# Patient Record
Sex: Male | Born: 1995 | Hispanic: Yes | Marital: Single | Smoking: Current some day smoker
Health system: Southern US, Community
[De-identification: ages and names within clinical notes are randomized; demographics above are authoritative.]

## PROBLEM LIST (undated history)

## (undated) DIAGNOSIS — Z789 Other specified health status: Secondary | ICD-10-CM

## (undated) HISTORY — PX: NO PAST SURGERIES: SHX2092

---
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
## 2015-04-17 ENCOUNTER — Encounter (HOSPITAL_COMMUNITY): Payer: Self-pay

## 2015-04-17 ENCOUNTER — Emergency Department (HOSPITAL_COMMUNITY)
Admission: EM | Admit: 2015-04-17 | Discharge: 2015-04-18 | Disposition: A | Discharge status: Other Acute Inpatient Hospital | Payer: No Typology Code available for payment source | Attending: Emergency Medicine | Admitting: Emergency Medicine

## 2015-04-17 DIAGNOSIS — Z720 Tobacco use: Secondary | ICD-10-CM | POA: Diagnosis not present

## 2015-04-17 DIAGNOSIS — R45851 Suicidal ideations: Secondary | ICD-10-CM

## 2015-04-17 DIAGNOSIS — F329 Major depressive disorder, single episode, unspecified: Secondary | ICD-10-CM | POA: Diagnosis not present

## 2015-04-17 DIAGNOSIS — Z79899 Other long term (current) drug therapy: Secondary | ICD-10-CM | POA: Diagnosis not present

## 2015-04-17 LAB — CBC
HCT: 48.8 % (ref 39.0–52.0)
Hemoglobin: 17.4 g/dL — ABNORMAL HIGH (ref 13.0–17.0)
MCH: 31.1 pg (ref 26.0–34.0)
MCHC: 35.7 g/dL (ref 30.0–36.0)
MCV: 87.3 fL (ref 78.0–100.0)
Platelets: 269 10*3/uL (ref 150–400)
RBC: 5.59 MIL/uL (ref 4.22–5.81)
RDW: 12.3 % (ref 11.5–15.5)
WBC: 9.7 10*3/uL (ref 4.0–10.5)

## 2015-04-17 LAB — SALICYLATE LEVEL: Salicylate Lvl: <4 mg/dL (ref 2.8–30.0)

## 2015-04-17 LAB — COMPREHENSIVE METABOLIC PANEL
ALBUMIN: 4.8 g/dL (ref 3.5–5.0)
ALT: 193 U/L — ABNORMAL HIGH (ref 17–63)
AST: 94 U/L — AB (ref 15–41)
Alkaline Phosphatase: 70 U/L (ref 38–126)
Anion gap: 4 — ABNORMAL LOW (ref 5–15)
BUN: 14 mg/dL (ref 6–20)
CHLORIDE: 106 mmol/L (ref 101–111)
CO2: 29 mmol/L (ref 22–32)
CREATININE: 1.01 mg/dL (ref 0.61–1.24)
Calcium: 9.7 mg/dL (ref 8.9–10.3)
GFR calc Af Amer: >60 mL/min (ref >60)
GFR calc non Af Amer: >60 mL/min (ref >60)
GLUCOSE: 102 mg/dL — AB (ref 65–99)
Potassium: 4 mmol/L (ref 3.5–5.1)
SODIUM: 139 mmol/L (ref 135–145)
Total Bilirubin: 0.8 mg/dL (ref 0.3–1.2)
Total Protein: 8 g/dL (ref 6.5–8.1)

## 2015-04-17 LAB — ACETAMINOPHEN LEVEL: Acetaminophen (Tylenol), Serum: <10 ug/mL — ABNORMAL LOW (ref 10–30)

## 2015-04-17 LAB — ETHANOL: Alcohol, Ethyl (B): <5 mg/dL (ref <5)

## 2015-04-17 NOTE — BH Assessment (Signed)
[facility], admitting RN on adult has requested pt come at 11:30. Informed Chartered certified accountantLynnsey RN.   Clista BernhardtNancy Nicky Kras, [facility] T[facility] Specialist 04/17/2015 10:56 PM

## 2015-04-17 NOTE — BH Assessment (Signed)
Reviewed ED notes prior to initiating assessment. Per notes pt has been having SI since age [age] and now feels that he is getting older he has more options for carrying out plans.   Assessment to commence shortly.    Clista BernhardtNancy Hanaan Gancarz, [facility] T[facility] Specialist 04/17/2015 9:44 PM

## 2015-04-17 NOTE — ED Notes (Addendum)
Pt presents with c/o suicidal thoughts. Pt reports he has been having these thoughts since he was 10 or 11 but now that he is much older he has more tools that he could use to hurt himself. Pt reports that he did overdose last year and cut his arms with a serrated blade and laid down in the bed. Pt reports that he is thinking something "quick and easy, like something to the brain". Pt denies HI at this time. Calm and cooperative.

## 2015-04-17 NOTE — ED Provider Notes (Signed)
CSN: [id number]     Arrival date & time 04/17/15  2053 History   First MD Initiated Contact with Patient 04/17/15 2103     Chief Complaint  Patient presents with  . Suicidal     (Consider location/radiation/quality/duration/timing/severity/associated sxs/prior Treatment) Patient is a 19 y.o. male presenting with mental health disorder. The history is provided by the patient.  Mental Health Problem Presenting symptoms: depression, suicidal thoughts and suicidal threats   Presenting symptoms: no suicide attempt   Degree of incapacity (severity):  Moderate Onset quality:  Gradual Timing:  Constant Progression:  Worsening Chronicity:  Chronic Context: stressful life event   Context: not recent medication change   Treatment compliance:  Untreated Relieved by:  Nothing Worsened by:  Nothing tried Associated symptoms: feelings of worthlessness (feels like he has no direction. He works with his father - in [employer] - but doesn't feel like he has any support)   Associated symptoms: no abdominal pain and no chest pain     History reviewed. No pertinent past medical history. History reviewed. No pertinent past surgical history. No family history on file. History  Substance Use Topics  . Smoking status: Current Some Day Smoker  . Smokeless tobacco: Not on file  . Alcohol Use: No    Review of Systems  Constitutional: Negative for fever.  Respiratory: Negative for cough and shortness of breath.   Cardiovascular: Negative for chest pain.  Gastrointestinal: Negative for vomiting and abdominal pain.  Psychiatric/Behavioral: Positive for suicidal ideas.  All other systems reviewed and are negative.     Allergies  Review of patient's allergies indicates no known allergies.  Home Medications   Prior to Admission medications   Medication Sig Start Date End Date Taking? Authorizing Provider  Ascorbic Acid (VITAMIN C PO) Take 1 tablet by mouth daily.   Yes Historical Provider, MD   ibuprofen (ADVIL,MOTRIN) 200 MG tablet Take 1,000-1,200 mg by mouth every 6 (six) hours as needed for moderate pain.   Yes Historical Provider, MD  Multiple Vitamin (MULTIVITAMIN WITH MINERALS) TABS tablet Take 1 tablet by mouth daily.   Yes Historical Provider, MD  Omega-3 Fatty Acids (FISH OIL PO) Take 1 capsule by mouth daily.   Yes Historical Provider, MD  terbinafine (LAMISIL AT) 1 % cream Apply to both feet 2 x day coating the entire foot and in between the toes for 14 days. Patient not taking: Reported on 04/17/2015 11/21/12   Arthor Captain, PA-C   BP 150/72 mmHg  Pulse 83  Temp(Src) 98.5 F (36.9 C) (Oral)  Resp 22  SpO2 96% Physical Exam  Constitutional: He is oriented to person, place, and time. He appears well-developed and well-nourished. No distress.  HENT:  Head: Normocephalic and atraumatic.  Mouth/Throat: No oropharyngeal exudate.  Eyes: EOM are normal. Pupils are equal, round, and reactive to light.  Neck: Normal range of motion. Neck supple.  Cardiovascular: Normal rate and regular rhythm.  Exam reveals no friction rub.   No murmur heard. Pulmonary/Chest: Effort normal and breath sounds normal. No respiratory distress. He has no wheezes. He has no rales.  Abdominal: He exhibits no distension. There is no tenderness. There is no rebound.  Musculoskeletal: Normal range of motion. He exhibits no edema.  Neurological: He is alert and oriented to person, place, and time.  Skin: He is not diaphoretic.  Psychiatric: He expresses suicidal ideation. He expresses no homicidal ideation. He expresses suicidal plans. He expresses no homicidal plans.  Nursing note and vitals reviewed.  ED Course  Procedures (including critical care time) Labs Review Labs Reviewed  COMPREHENSIVE METABOLIC PANEL - Abnormal; Notable for the following:    Glucose, Bld 102 (*)    AST 94 (*)    ALT 193 (*)    Anion gap 4 (*)    All other components within normal limits  ACETAMINOPHEN LEVEL -  Abnormal; Notable for the following:    Acetaminophen (Tylenol), Serum <10 (*)    All other components within normal limits  CBC - Abnormal; Notable for the following:    Hemoglobin 17.4 (*)    All other components within normal limits  ETHANOL  SALICYLATE LEVEL  URINE RAPID DRUG SCREEN, HOSP PERFORMED    Imaging Review No results found.   EKG Interpretation None      MDM   Final diagnoses:  Suicidal ideation    64M here suicidal. Present for a long time, about 5-6 years, however states he's been seriously thinking about it for 2 years. Denies any homicidal ideation.  Patient here voluntarily. Patient admitted to Psych.    Elwin MochaBlair Shayra Anton, MD 04/17/15 [id number]

## 2015-04-17 NOTE — ED Notes (Signed)
[facility] called for transport. 
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
## 2015-04-18 ENCOUNTER — Encounter (HOSPITAL_COMMUNITY): Payer: Self-pay | Admitting: *Deleted

## 2015-04-18 ENCOUNTER — Inpatient Hospital Stay (HOSPITAL_COMMUNITY)
Admission: EM | Admit: 2015-04-18 | Discharge: 2015-04-20 | DRG: 885 | Disposition: A | Discharge status: Home / Self Care | Payer: Federal, State, Local not specified - Other | Source: Intra-hospital | Attending: Psychiatry | Admitting: Psychiatry

## 2015-04-18 DIAGNOSIS — R45851 Suicidal ideations: Secondary | ICD-10-CM | POA: Diagnosis present

## 2015-04-18 DIAGNOSIS — F332 Major depressive disorder, recurrent severe without psychotic features: Secondary | ICD-10-CM | POA: Diagnosis present

## 2015-04-18 DIAGNOSIS — K769 Liver disease, unspecified: Secondary | ICD-10-CM

## 2015-04-18 DIAGNOSIS — F17210 Nicotine dependence, cigarettes, uncomplicated: Secondary | ICD-10-CM | POA: Diagnosis present

## 2015-04-18 DIAGNOSIS — F329 Major depressive disorder, single episode, unspecified: Secondary | ICD-10-CM | POA: Diagnosis present

## 2015-04-18 HISTORY — DX: Other specified health status: Z78.9

## 2015-04-18 LAB — TSH: TSH: 5.784 u[IU]/mL — ABNORMAL HIGH (ref 0.350–4.500)

## 2015-04-18 LAB — RAPID URINE DRUG SCREEN, HOSP PERFORMED
AMPHETAMINES: NOT DETECTED
Barbiturates: NOT DETECTED
Benzodiazepines: NOT DETECTED
Cocaine: NOT DETECTED
Opiates: NOT DETECTED
Tetrahydrocannabinol: NOT DETECTED

## 2015-04-18 MED ORDER — TRAZODONE HCL 50 MG PO TABS
50.0000 mg | ORAL_TABLET | Freq: Every evening | ORAL | Status: DC | PRN
  Administered 2015-04-18 – 2015-04-19 (×2): 50 mg via ORAL
  Filled 2015-04-18 (×3): qty 1
  Filled 2015-04-18: qty 14
  Filled 2015-04-18 (×2): qty 1
  Filled 2015-04-18: qty 14
  Filled 2015-04-18: qty 1

## 2015-04-18 MED ORDER — ACETAMINOPHEN 325 MG PO TABS: 650.0000 mg | ORAL_TABLET | Freq: Four times a day (QID) | ORAL | Status: DC | PRN

## 2015-04-18 MED ORDER — CITALOPRAM HYDROBROMIDE 10 MG PO TABS
10.0000 mg | ORAL_TABLET | Freq: Every day | ORAL | Status: DC
  Administered 2015-04-18 – 2015-04-19 (×2): 10 mg via ORAL
  Filled 2015-04-18 (×3): qty 1

## 2015-04-18 MED ORDER — MAGNESIUM HYDROXIDE 400 MG/5ML PO SUSP
30.0000 mL | Freq: Every day | ORAL | Status: DC | PRN
  Administered 2015-04-19: 30 mL via ORAL
  Filled 2015-04-18: qty 30

## 2015-04-18 MED ORDER — HYDROXYZINE HCL 25 MG PO TABS
25.0000 mg | ORAL_TABLET | Freq: Four times a day (QID) | ORAL | Status: DC | PRN
  Filled 2015-04-18: qty 20

## 2015-04-18 MED ORDER — ALUM & MAG HYDROXIDE-SIMETH 200-200-20 MG/5ML PO SUSP: 30.0000 mL | ORAL | Status: DC | PRN

## 2015-04-18 NOTE — Plan of Care (Signed)
Problem: Diagnosis: Increased Risk For Suicide Attempt Goal: STG-Patient Will Attend All Groups On The Unit Outcome: Progressing Pt attended and participated in evening group on 04/18/15.

## 2015-04-18 NOTE — Progress Notes (Addendum)
Patient flat, depressed both in affect and mood. Rating his depression at a 7/10, hopelessness at a 6/10 and anxiety at an 8/10. Patient reports very poor sleep last night. Patient visible in dayroom however forwards little 1:1 with this Clinical research associatewriter. Reports his goal is to "find the courage to share about myself and to be more talkative." Patient offered support and reassurance. Encouraged to participate in programming. Also encouraged to speak to provider regarding sleep concerns. Patient verbalized understanding. He does endorse thoughts to harm self and verbally contracts for safety. No HI. Patient safe. Maurice Freeman, Maurice Freeman

## 2015-04-18 NOTE — Plan of Care (Signed)
Problem: Ineffective individual coping Goal: STG: Patient will remain free from self harm Outcome: Progressing Patient endorsing thoughts to harm self however has not engaged. Verbally contracts for safety.   Problem: Alteration in mood Goal: STG-Patient reports thoughts of self-harm to staff Outcome: Progressing Patient open with this writer about his thoughts of self harm. Contracts.

## 2015-04-18 NOTE — [facility] Suicide Risk Assessment (Signed)
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 MDD (major depressive disorder), recurrent episode, severe [F33.2] 04/18/2015     Continued Clinical Symptoms:  Alcohol Use Disorder Identification Test Final Score (AUDIT): 3 The "Alcohol Use Disorders Identification Test", Guidelines for Use in Primary Care, Second Edition.  World Science [employer]Health [employer] [facility](WHO). Score between 0-7:  no or low risk or alcohol related problems. Score between 8-15:  moderate risk of alcohol related problems. Score between 16-19:  high risk of alcohol related problems. Score 20 or above:  warrants further diagnostic evaluation for alcohol dependence and treatment.   CLINICAL FACTORS:  19 year old male, reports chronic depression, with chronic mostly passive SI and prior history of self cutting. No history of psychosis, no clear history of hypomania or mania, denies drug or alcohol abuse . Increased depression and suicidal ruminations recently. No prior psychiatric medication trials.   Musculoskeletal: Strength & Muscle Tone: within normal limits Gait & Station: normal Patient leans: N/A  Psychiatric Specialty Exam: Physical Exam  ROS  Blood pressure 113/76, pulse 75, temperature 99.2 F (37.3 C), temperature source Oral, resp. rate 18, height 5\' 7"  (1.702 m), weight 223 lb (101.152 kg).Body mass index is 34.92 kg/(m^2).   See Admit Note MSE                                                        COGNITIVE FEATURES THAT CONTRIBUTE TO RISK:  Closed-mindedness and Loss of executive function    SUICIDE RISK:   Moderate:  Frequent suicidal ideation with limited intensity, and duration, some specificity in terms of plans, no associated intent, good self-control, limited dysphoria/symptomatology, some risk factors present, and identifiable protective factors, including available and accessible social support.  PLAN OF CARE: Patient will be admitted to inpatient psychiatric unit for stabilization and safety. Will provide and encourage milieu participation. Provide medication management and maked adjustments as needed.  Will follow daily.    Medical Decision Making:  New problem, with additional work up planned, Review of Psycho-Social Stressors (1), Review or order clinical lab tests (1), Established Problem, Worsening (2) and Review of Medication Regimen & Side Effects (2)  I certify that inpatient services furnished can reasonably be expected to improve the patient's condition.   COBOS, FERNANDO 04/18/2015, 5:16 PM

## 2015-04-18 NOTE — [facility] Group Notes (Signed)
[facility]  04/18/2015 8:45 AM  Participation Quality: Alert, Appropriate and Oriented  Mood/Affect: Flat  Depression Rating: 8  Anxiety Rating: 8  Thoughts of Suicide: Pt endorses passive SI  Will you contract for safety? Yes  Current AVH: Pt denies  Plan for Discharge/Comments: Pt attended discharge planning group and actively participated in group. CSW discussed suicide prevention education with the group and encouraged them to discuss discharge planning and any relevant barriers. Pt reports that he is still feeling depressed but is hoping to find help in the hospital.  Transportation Means: Pt reports access to transportation  Supports: Emeline GeneralFriend  Clytee Heinrich Carter, LCSWA 04/18/2015 12:21 PM

## 2015-04-18 NOTE — Progress Notes (Signed)
19 year old male pt admitted on voluntary basis. Pt reports that he has been feeling depressed and suicidal and cites difficulties with parents, occupational and financial stressors that help contribute to his depression. Pt reports he has been feeling depressed for awhile and feels his parents are not supportive of him. Pt reports that he is not and has never been on any medications but reported that he would like to not feel depressed. Pt does have some scars to left arm that he reports were self inflicted. Pt denied any SI during admission process and able to contract for safety on the unit. Pt was oriented to unit and safety maintained.

## 2015-04-18 NOTE — H&P (Addendum)
Psychiatric Admission Assessment Adult  Patient Identification: Maurice Freeman MRN:  [id number] Date of Evaluation:  04/18/2015 Chief Complaint:   " Depression" Principal Diagnosis: <principal problem not specified> Diagnosis:   Patient Active Problem List   Diagnosis Date Noted  . 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 He states he has chronic depression, which started about 8 years ago. He states he has a friend who also has depression and they " try to support one another", but recently has felt more depressed and so decided to seek treatment. He endorses some neuro-vegetative symptoms of depression as below, and states he has a history of cutting. He has had suicidal ideations, mostly passive , for a long time, but states recently has been thinking of tools he could use to hurt himself , due to which he decided to seek treatment. He also reports sleep paralysis symptoms where he wakes up but cannot move body and feels " a presence " in his room. He denies any hallucinatory experiences other than in this context. Denies narcolepsy, sleep walking. He also describes anxiety, with some social anxiety, phobia.   Elements:   Chronic depression, chronic passive SI, recently increasing, with some suicidal thoughts of cutting .  Associated Signs/Symptoms: Depression Symptoms:  depressed mood, anhedonia, insomnia, recurrent thoughts of death, suicidal thoughts without plan, suicidal thoughts with specific plan, anxiety, loss of energy/fatigue, (Hypo) Manic Symptoms:   Denies . At this time does not present with pressured speech, rapid or racing thoughts, and does not present with any manic symptoms currently. Anxiety Symptoms:   Social anxiety symptoms described. Also some panic attacks . Psychotic Symptoms:   Denies PTSD Symptoms: Does not endorse  Total Time spent with patient: 45 minutes   Psychiatric History- endorses chronic depression, going on for years. Describes history of self cutting and chronic suicidal ruminations, mostly passive , denies history of mania, describes some  Brief mood instability/ swings , but not hypomania.  Self cutting episodes have decreased overtime.  Has not been on psychiatric medications in the past, no prior psychiatric admissions.   Past Medical History:  Denies medical illnesses, smokes 1 PPD, NKDA Past Medical History  Diagnosis Date  . Medical history non-contributory     Past Surgical History  Procedure Laterality Date  . No past surgeries     Family History: lives with parents, has two siblings, no mental illness, no suicides in family.  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 No SO at this time, denies legal issues .  History  Alcohol Use  . Yes    Comment: once a week     History  Drug Use No    History   Social History  . Marital Status: Single    Spouse Name: N/A  . Number of Children: N/A  . Years of Education: N/A   Social History Main Topics  . Smoking status: Current Some Day Smoker -- 0.25 packs/day  . Smokeless tobacco: Not on file  . Alcohol Use: Yes     Comment: once a week  . Drug Use: No  . Sexual Activity: Not on file   Other Topics Concern  . None   Social History Narrative   Additional Social  History:  Musculoskeletal: Strength & Muscle Tone: within normal limits Gait & Station: normal Patient leans: N/A  Psychiatric Specialty Exam: Physical Exam  Review of Systems  Constitutional: Negative.   HENT: Negative.   Eyes: Negative.   Respiratory: Negative.   Cardiovascular: Negative.   Gastrointestinal: Negative.   Genitourinary: Negative.   Musculoskeletal:  Negative.   Skin: Negative.   Neurological: Negative.   Endo/Heme/Allergies: Negative.   Psychiatric/Behavioral: Positive for depression and suicidal ideas. The patient is nervous/anxious and has insomnia.   All other systems negative   Blood pressure 113/76, pulse 75, temperature 99.2 F (37.3 C), temperature source Oral, resp. rate 18, height 5\' 7"  (1.702 m), weight 223 lb (101.152 kg).Body mass index is 34.92 kg/(m^2).  General Appearance: Fairly Groomed  Patent attorneyye Contact::  Good  Speech:  Normal Rate  Volume:  Decreased  Mood:  Anxious and Depressed  Affect:  Constricted  Thought Process:  Goal Directed and Linear  Orientation:  Full (Time, Place, and Person)  Thought Content:  denies hallucinations, no delusions  Suicidal Thoughts:  No- at present denies any suicidal plan or intention and contracts for safety on the unit .  Homicidal Thoughts:  No  Memory:  recent and remote grossly intact   Judgement:  Fair  Insight:  Fair  Psychomotor Activity:  Normal  Concentration:  Good  Recall:  Good  Fund of Knowledge:Good  Language: Fair  Akathisia:  Negative  Handed:  Right  AIMS (if indicated):     Assets:  Desire for Improvement Physical Health Resilience  ADL's:  Fair   Cognition: WNL  Sleep:  Number of Hours: 4.25   Risk to Self: Is patient at risk for suicide?: Yes What has been your use of drugs/alcohol within the last 12 months?: no drug use; Reports one bottle of beer weekly, occassionally gets drunk Risk to Others:   Prior Inpatient Therapy:   Prior Outpatient Therapy:    Alcohol Screening: 1. How often do you have a drink containing alcohol?: 2 to 4 times a month 2. How many drinks containing alcohol do you have on a typical day when you are drinking?: 1 or 2 3. How often do you have six or more drinks on one occasion?: Less than monthly Preliminary Score: 1 4. How often during the last year have you found that you were not able to stop drinking once you had  started?: Never 5. How often during the last year have you failed to do what was normally expected from you becasue of drinking?: Never 6. How often during the last year have you needed a first drink in the morning to get yourself going after a heavy drinking session?: Never 7. How often during the last year have you had a feeling of guilt of remorse after drinking?: Never 8. How often during the last year have you been unable to remember what happened the night before because you had been drinking?: Never 9. Have you or someone else been injured as a result of your drinking?: No 10. Has a relative or friend or a doctor or another health worker been concerned about your drinking or suggested you cut down?: No Alcohol Use Disorder Identification Test Final Score (AUDIT): 3 Brief Intervention: AUDIT score less than 7 or less-screening does not suggest unhealthy drinking-brief intervention not indicated  Allergies:  No Known Allergies Lab Results:  Results for orders placed or performed during the hospital encounter of 04/18/15 (from the past 48 hour(s))  Urine rapid drug  screen (hosp performed)     Status: None   Collection Time: 04/18/15  6:20 AM  Result Value Ref Range   Opiates NONE DETECTED NONE DETECTED   Cocaine NONE DETECTED NONE DETECTED   Benzodiazepines NONE DETECTED NONE DETECTED   Amphetamines NONE DETECTED NONE DETECTED   Tetrahydrocannabinol NONE DETECTED NONE DETECTED   Barbiturates NONE DETECTED NONE DETECTED    Comment:        DRUG SCREEN FOR MEDICAL PURPOSES ONLY.  IF CONFIRMATION IS NEEDED FOR ANY PURPOSE, NOTIFY LAB WITHIN 5 DAYS.        LOWEST DETECTABLE LIMITS FOR URINE DRUG SCREEN Drug Class       Cutoff (ng/mL) Amphetamine      1000 Barbiturate      200 Benzodiazepine   200 Tricyclics       300 Opiates          300 Cocaine          300 THC              50 Performed at [facility]   TSH     Status: Abnormal   Collection Time: 04/18/15   6:24 AM  Result Value Ref Range   TSH 5.784 (H) 0.350 - 4.500 uIU/mL    Comment: Performed at [facility]   Current Medications: Current Facility-Administered Medications  Medication Dose Route Frequency Provider Last Rate Last Dose  . acetaminophen (TYLENOL) tablet 650 mg  650 mg Oral Q6H PRN Kerry Hough, PA-C      . alum & mag hydroxide-simeth (MAALOX/MYLANTA) 200-200-20 MG/5ML suspension 30 mL  30 mL Oral Q4H PRN Kerry Hough, PA-C      . citalopram (CELEXA) tablet 10 mg  10 mg Oral Daily Craige Cotta, MD   10 mg at 04/18/15 1310  . hydrOXYzine (ATARAX/VISTARIL) tablet 25 mg  25 mg Oral Q6H PRN Kerry Hough, PA-C      . magnesium hydroxide (MILK OF MAGNESIA) suspension 30 mL  30 mL Oral Daily PRN Kerry Hough, PA-C      . traZODone (DESYREL) tablet 50 mg  50 mg Oral QHS,MR X 1 Spencer E Simon, PA-C       PTA Medications: Prescriptions prior to admission  Medication Sig Dispense Refill Last Dose  . Ascorbic Acid (VITAMIN C PO) Take 1 tablet by mouth daily.   04/14/2015  . ibuprofen (ADVIL,MOTRIN) 200 MG tablet Take 1,000-1,200 mg by mouth every 6 (six) hours as needed for moderate pain.   Past Month at Unknown time  . Multiple Vitamin (MULTIVITAMIN WITH MINERALS) TABS tablet Take 1 tablet by mouth daily.   04/14/2015  . Omega-3 Fatty Acids (FISH OIL PO) Take 1 capsule by mouth daily.   Past Week at Unknown time  . terbinafine (LAMISIL AT) 1 % cream Apply to both feet 2 x day coating the entire foot and in between the toes for 14 days. (Patient not taking: Reported on 04/17/2015) 30 g 0     Previous Psychotropic Medications:  No , patient states he has not been on any psychiatric medications in the past .  Substance Abuse History in the last 12 months:   Denies drug or alcohol abuse     Consequences of Substance Abuse: denies   Results for orders placed or performed during the hospital encounter of 04/18/15 (from the past 72 hour(s))  Urine rapid  drug screen (hosp performed)     Status: None  Collection Time: 04/18/15  6:20 AM  Result Value Ref Range   Opiates NONE DETECTED NONE DETECTED   Cocaine NONE DETECTED NONE DETECTED   Benzodiazepines NONE DETECTED NONE DETECTED   Amphetamines NONE DETECTED NONE DETECTED   Tetrahydrocannabinol NONE DETECTED NONE DETECTED   Barbiturates NONE DETECTED NONE DETECTED    Comment:        DRUG SCREEN FOR MEDICAL PURPOSES ONLY.  IF CONFIRMATION IS NEEDED FOR ANY PURPOSE, NOTIFY LAB WITHIN 5 DAYS.        LOWEST DETECTABLE LIMITS FOR URINE DRUG SCREEN Drug Class       Cutoff (ng/mL) Amphetamine      1000 Barbiturate      200 Benzodiazepine   200 Tricyclics       300 Opiates          300 Cocaine          300 THC              50 Performed at [facility]   TSH     Status: Abnormal   Collection Time: 04/18/15  6:24 AM  Result Value Ref Range   TSH 5.784 (H) 0.350 - 4.500 uIU/mL    Comment: Performed at [facility]    Observation Level/Precautions:  15 minute checks  Laboratory:  follow up on high TSH with FT3, FT4, also follow up on elevated LFTs by repeating LFTs and agrees to test for Hep C, B. Denies risk factors.  Psychotherapy: support, milieu   Medications:   Agrees to starting an SSRI- start CELEXA 10 mgrs QDAY- we reviewed side effects and potential for increased agitation or SI early in treatment in young adults   Consultations:  If needed   Discharge Concerns:  -   Estimated LOS: 6 days   Other:     Psychological Evaluations:  No }  Treatment Plan Summary: Daily contact with patient to assess and evaluate symptoms and progress in treatment, Medication management, Plan inpatient admission and medications as above   Medical Decision Making:  New problem, with additional work up planned, Review of Psycho-Social Stressors (1), Review or order clinical lab tests (1), Established Problem, Worsening (2) and Review of New Medication or Change  in Dosage (2)  I certify that inpatient services furnished can reasonably be expected to improve the patient's condition.   Nehemiah Massed 7/20/20164:53 PM
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
## 2015-04-18 NOTE — Progress Notes (Signed)
[facility] Group Notes:  (Nursing/MHT/Case Management/Adjunct)  Date:  04/18/2015  Time:  7:56 PM  Type of Therapy:  Psychoeducational Skills  Participation Level:  Active  Participation Quality:  Appropriate  Affect:  Appropriate  Cognitive:  Appropriate  Insight:  Good  Engagement in Group:  Engaged  Modes of Intervention:  Discussion  Summary of Progress/Problems: Tonight in Wrap up group Maurice Freeman said that his morning started off a little rough since he had little sleep, but his day was better for him once he was able to see the SW and Doctor. He said that he had a visit from both his brother and sister and being here has helped him to feel like he is in an environment where he can share his true feelings to the ones that love him.  [name] 04/18/2015, 7:56 PM

## 2015-04-18 NOTE — Progress Notes (Signed)
Recreation Therapy Notes  Date: 07.20.16 Time: 9:30 am Location: [address] Group Room  Group Topic: Stress Management  Goal Area(s) Addresses:  Patient will verbalize importance of using healthy stress management.  Patient will identify positive emotions associated with healthy stress management.   Intervention: Stress Management  Activity :  Progressive Muscle Relaxation.  LRT will introduce and educate patients on the stress management technique of progressive muscle relaxation.  A script was used to present  the technique to the patients.  Patients were asked to follow a long with the script read a loud by the LRT.  Education:  Stress Management, Discharge Planning.   Education Outcome: Acknowledges edcuation/In group clarification offered/Needs additional education  Clinical Observations/Feedback: Patient did not attend group.   Caroll RancherMarjette Katira Dumais, LRT/CTRS         Lillia AbedLindsay, Presley Summerlin A 04/18/2015 10:05 AM

## 2015-04-18 NOTE — [facility] Group Notes (Signed)
[facility] LCSW Group Therapy 04/18/2015 1:15 PM  Type of Therapy: Group Therapy- Emotion Regulation  Participation Level: Active   Participation Quality:  Appropriate  Affect: Appropriate  Cognitive: Alert and Oriented   Insight:  Developing/Improving  Engagement in Therapy: Developing/Improving and Engaged   Modes of Intervention: Clarification, Confrontation, Discussion, Education, Exploration, Limit-setting, Orientation, Problem-solving, Rapport Building, Dance movement psychotherapisteality Testing, Socialization and Support  Summary of Progress/Problems: The topic for group today was emotional regulation. This group focused on both positive and negative emotion identification and allowed group members to process ways to identify feelings, regulate negative emotions, and find healthy ways to manage internal/external emotions. Group members were asked to reflect on a time when their reaction to an emotion led to a negative outcome and explored how alternative responses using emotion regulation would have benefited them. Group members were also asked to discuss a time when emotion regulation was utilized when a negative emotion was experienced. Pt participated appropriately in group and was able to identify emotions that were difficult to handle. Pt expressed that he has difficulty managing his anger, especially with his family members. Pt was able to identify times in the past when he effectively managed his anger.   Chad CordialLauren Carter, LCSWA 04/18/2015 4:08 PM

## 2015-04-18 NOTE — Tx Team (Signed)
Initial Interdisciplinary Treatment Plan   PATIENT STRESSORS: Financial difficulties Marital or family conflict Occupational concerns   PATIENT STRENGTHS: Ability for insight Average or above average intelligence Capable of independent living General fund of knowledge Motivation for treatment/growth   PROBLEM LIST: Problem List/Patient Goals Date to be addressed Date deferred Reason deferred Estimated date of resolution  Depression 04/18/15     Suicidal Ideation 04/18/15     "I want to get over this state of depression" 04/18/15     "I want to dial down my suicidal thoughts" 04/18/15                                    DISCHARGE CRITERIA:  Ability to meet basic life and health needs Improved stabilization in mood, thinking, and/or behavior Verbal commitment to aftercare and medication compliance  PRELIMINARY DISCHARGE PLAN: Attend aftercare/continuing care group Return to previous living arrangement  PATIENT/FAMIILY INVOLVEMENT: This treatment plan has been presented to and reviewed with the patient, Jerrye NobleJose Melendez-Sanchez, and/or family member, .  The patient and family have been given the opportunity to ask questions and make suggestions.  [place] Gustafson, [place] 04/18/2015, 1:20 AM

## 2015-04-19 LAB — HEPATIC FUNCTION PANEL
ALK PHOS: 61 U/L (ref 38–126)
ALT: 173 U/L — ABNORMAL HIGH (ref 17–63)
AST: 72 U/L — AB (ref 15–41)
Albumin: 4.2 g/dL (ref 3.5–5.0)
BILIRUBIN TOTAL: 0.8 mg/dL (ref 0.3–1.2)
Bilirubin, Direct: 0.1 mg/dL (ref 0.1–0.5)
Indirect Bilirubin: 0.7 mg/dL (ref 0.3–0.9)
TOTAL PROTEIN: 7.1 g/dL (ref 6.5–8.1)

## 2015-04-19 LAB — HEPATITIS PANEL, ACUTE
HCV Ab: <0.1 s/co ratio (ref 0.0–0.9)
Hep A IgM: NEGATIVE
Hep B C IgM: NEGATIVE
Hepatitis B Surface Ag: NEGATIVE

## 2015-04-19 LAB — T4, FREE: Free T4: 0.99 ng/dL (ref 0.61–1.12)

## 2015-04-19 MED ORDER — CITALOPRAM HYDROBROMIDE 20 MG PO TABS
20.0000 mg | ORAL_TABLET | Freq: Every day | ORAL | Status: DC
  Administered 2015-04-20: 20 mg via ORAL
  Filled 2015-04-19: qty 14
  Filled 2015-04-19 (×2): qty 1

## 2015-04-19 NOTE — Progress Notes (Signed)
Patient attended karaoke and sang several songs, participation was excellent.

## 2015-04-19 NOTE — Significant Event (Signed)
Received a call from Dr. Jama Flavors This patient has elevated ALT to AST suggestive of primary liver pathology No significant exposures to alcohol Acute hepatitis panel is negative LFTs are trending down I will order a complete metabolic panel, INR, gamma GGT, ultrasound abdomen pelvis and if there is any acute new finding I've encouraged Dr. Jama Flavors to reach out to be personally and pager number below and I can help direct care and need for follow-up  I'm available to formally consult if there are further issues  Thank you for this consult Pleas Koch, MD [facility] (P) [phone]

## 2015-04-19 NOTE — Progress Notes (Signed)
D:  Per pt self inventory pt reports sleeping fair, appetite good, energy level normal, ability to pay attention good, rates depression at a 0 out of 10, hopelessness at a 0 out of 10, anxiety at a 2 out of 10, denies SI/HI/AVH, goal today: "Talk more and engage more", anxious during interaction.    A:  Emotional support provided, Encouraged pt to continue with treatment plan and attend all group activities, q15 min checks maintained for safety.  R:  Pt is pleasant, receptive to treatment plan, no complaints this am, pleasant and cooperative with staff and other patients on the unit.

## 2015-04-19 NOTE — Progress Notes (Signed)
D: Pt has depressed affect and mood.  He reports he had a good visit with his brother and sister this evening.  Pt reports his goal is to "try to be more active and social."  Pt reports he attended group today.  Pt denies SI/HI, denies hallucinations, denies pain.  Pt has been visible in milieu interacting with peers and staff appropriately.  Pt attended evening group.   A: Introduced self to pt.  Met with pt 1:1 and provided support and encouragement.  Actively listened to pt.  Medications administered per order.   R: Pt is compliant with medications.  Pt verbally contracts for safety.  Will continue to monitor and assess.

## 2015-04-19 NOTE — Progress Notes (Signed)
[facility] MD Progress Note  04/19/2015 1:33 PM Maurice Freeman  MRN:  [id number] Subjective:  At this time patient states he is feeling better and today not particularly sad or depressed. States " I am definitely feeling better". He states a visit from his sister and brother was helpful and he felt supported. Denies medication side effects thus far. Objective : I have reviewed case with treatment team and have met with patient. He is feeling better, and today minimizes depression. States mood is "OK" today. Denies any current SI, and has not exhibited any self injurious behaviors on unit. Denies any increased agitation or activation on Celexa trial thus far. He states he is hoping for discharge soon. Based on his report that significant stress is related to his relationship with father, I have offered a family session- he is not interested. States his plan is to go live with brother after discharge, which is a pleasant and non stressful environment for him . 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 Of note, has elevated transaminases - unclear etiology- no symptoms /asymptomatic. Viral hepatitis work up negative , and he states he does not drink much at all. Denies any recent medication use or abusing or overdosing on Acetaminophen . Today's levels are trending down compared to admission. Also, FT4  WNL.  Principal Problem: MDD (major depressive disorder), recurrent episode, severe Diagnosis:   Patient Active Problem List   Diagnosis Date Noted  . MDD (major depressive disorder), recurrent episode, severe [F33.2] 04/18/2015   Total Time spent with patient: 25 minutes    Past Medical History:  Past Medical History  Diagnosis Date  . Medical history non-contributory     Past Surgical History  Procedure Laterality Date  . No past surgeries     Family History: History reviewed. No pertinent family history. Social History:  History  Alcohol Use  . Yes    Comment:  once a week     History  Drug Use No    History   Social History  . Marital Status: Single    Spouse Name: N/A  . Number of Children: N/A  . Years of Education: N/A   Social History Main Topics  . Smoking status: Current Some Day Smoker -- 0.25 packs/day  . Smokeless tobacco: Not on file  . Alcohol Use: Yes     Comment: once a week  . Drug Use: No  . Sexual Activity: Not on file   Other Topics Concern  . None   Social History Narrative   Additional History:    Sleep: Fair  Appetite:  Good   Assessment:   Musculoskeletal: Strength & Muscle Tone: within normal limits Gait & Station: normal Patient leans: N/A   Psychiatric Specialty Exam: Physical Exam  ROS- no jaundice, no RUQ pain, no choluria reported .   Blood pressure 109/68, pulse 60, temperature 97.5 F (36.4 C), temperature source Oral, resp. rate 18, height 5' 7"  (1.702 m), weight 223 lb (101.152 kg).Body mass index is 34.92 kg/(m^2).  General Appearance: improved grooming  Eye Contact::  Good  Speech:  Normal Rate  Volume:  Normal  Mood:  reports improvement, less depressed   Affect:  Appropriate and reactive   Thought Process:  Goal Directed and Linear  Orientation:  Full (Time, Place, and Person)  Thought Content:  denies hallucinations, no delusions   Suicidal Thoughts:  No- currently denies any suicidal plan or intention and denies any thoughts of cutting  Homicidal Thoughts:  No  Memory:  recent and remote grossly intact   Judgement:  Fair  Insight:  Fair  Psychomotor Activity:  Normal  Concentration:  Good  Recall:  Good  Fund of Knowledge:Good  Language: Good  Akathisia:  Negative  Handed:  Right  AIMS (if indicated):     Assets:  Communication Skills Desire for Improvement Physical Health Resilience  ADL's:improved   Cognition: WNL  Sleep:  Number of Hours: 4.25     Current Medications: Current Facility-Administered Medications  Medication Dose Route Frequency Provider  Last Rate Last Dose  . alum & mag hydroxide-simeth (MAALOX/MYLANTA) 200-200-20 MG/5ML suspension 30 mL  30 mL Oral Q4H PRN Laverle Hobby, PA-C      . [START ON 04/20/2015] citalopram (CELEXA) tablet 20 mg  20 mg Oral Daily Myer Peer Cobos, MD      . hydrOXYzine (ATARAX/VISTARIL) tablet 25 mg  25 mg Oral Q6H PRN Laverle Hobby, PA-C      . magnesium hydroxide (MILK OF MAGNESIA) suspension 30 mL  30 mL Oral Daily PRN Laverle Hobby, PA-C      . traZODone (DESYREL) tablet 50 mg  50 mg Oral QHS,MR X 1 Laverle Hobby, PA-C   50 mg at 04/18/15 2137    Lab Results:  Results for orders placed or performed during the hospital encounter of 04/18/15 (from the past 48 hour(s))  Urine rapid drug screen (hosp performed)     Status: None   Collection Time: 04/18/15  6:20 AM  Result Value Ref Range   Opiates NONE DETECTED NONE DETECTED   Cocaine NONE DETECTED NONE DETECTED   Benzodiazepines NONE DETECTED NONE DETECTED   Amphetamines NONE DETECTED NONE DETECTED   Tetrahydrocannabinol NONE DETECTED NONE DETECTED   Barbiturates NONE DETECTED NONE DETECTED    Comment:        DRUG SCREEN FOR MEDICAL PURPOSES ONLY.  IF CONFIRMATION IS NEEDED FOR ANY PURPOSE, NOTIFY LAB WITHIN 5 DAYS.        LOWEST DETECTABLE LIMITS FOR URINE DRUG SCREEN Drug Class       Cutoff (ng/mL) Amphetamine      1000 Barbiturate      200 Benzodiazepine   096 Tricyclics       045 Opiates          300 Cocaine          300 THC              50 Performed at [facility]   TSH     Status: Abnormal   Collection Time: 04/18/15  6:24 AM  Result Value Ref Range   TSH 5.784 (H) 0.350 - 4.500 uIU/mL    Comment: Performed at [facility]  Hepatitis panel, acute     Status: None   Collection Time: 04/18/15  6:24 AM  Result Value Ref Range   Hepatitis B Surface Ag Negative Negative   HCV Ab <0.1 0.0 - 0.9 s/co ratio    Comment: (NOTE)                                  Negative:     < 0.8                              Indeterminate: 0.8 - 0.9  Positive:     > 0.9 The CDC recommends that a positive HCV antibody result be followed up with a HCV Nucleic Acid Amplification test ([id number]). Performed At: [facility] [place], [place] [id number] Lindon Romp MD XB:[id number]    Hep A IgM Negative Negative   Hep B C IgM Negative Negative    Comment: Performed at [facility]  T4, free     Status: None   Collection Time: 04/19/15  6:25 AM  Result Value Ref Range   Free T4 0.99 0.61 - 1.12 ng/dL    Comment: Performed at [facility]  Hepatic function panel     Status: Abnormal   Collection Time: 04/19/15  6:25 AM  Result Value Ref Range   Total Protein 7.1 6.5 - 8.1 g/dL   Albumin 4.2 3.5 - 5.0 g/dL   AST 72 (H) 15 - 41 U/L   ALT 173 (H) 17 - 63 U/L   Alkaline Phosphatase 61 38 - 126 U/L   Total Bilirubin 0.8 0.3 - 1.2 mg/dL   Bilirubin, Direct 0.1 0.1 - 0.5 mg/dL   Indirect Bilirubin 0.7 0.3 - 0.9 mg/dL    Comment: Performed at [facility]    Physical Findings: AIMS: Facial and Oral Movements Muscles of Facial Expression: None, normal Lips and Perioral Area: None, normal Jaw: None, normal Tongue: None, normal,Extremity Movements Upper (arms, wrists, hands, fingers): None, normal Lower (legs, knees, ankles, toes): None, normal, Trunk Movements Neck, shoulders, hips: None, normal, Overall Severity Severity of abnormal movements (highest score from questions above): None, normal Incapacitation due to abnormal movements: None, normal Patient's awareness of abnormal movements (rate only patient's report): No Awareness, Dental Status Current problems with teeth and/or dentures?: No Does patient usually wear dentures?: No  CIWA:    COWS:      Assessment- at this time patient improved - states mood is better and denies depression or SI today. So far tolerating Celexa well  without any evidence of activation or increased SI thus far. He is less anxious and today more future oriented, wanting to be discharged soon with a plan of going to brother's rather than returning to parents' home, which he states is more stressful. Elevated LFTs improving- no clear etiology at this time- no associated symptoms. TSH elevated but no clear symptoms of hypothyrodism and FT4 WNL.   Treatment Plan Summary: Daily contact with patient to assess and evaluate symptoms and progress in treatment, Medication management, Plan inpatient treatment and medication management as below  Increase CELEXA to 20 mgrs QDAY for depression, anxiety Continue Trazodone 50 mgrs QHS PRN for insomnia as needed  Continue Vistaril PRNs for Anxiety as needed  Consider discharge soon as he continues to improve      Medical Decision Making:  Established Problem, Stable/Improving (1), New problem, with additional work up planned, Review of Psycho-Social Stressors (1) and Review of Medication Regimen & Side Effects (2)     COBOS, FERNANDO 04/19/2015, 1:33 PM

## 2015-04-19 NOTE — [facility] Group Notes (Signed)
[facility] [employer] Group Therapy 04/19/2015 1:15pm  Type of Therapy: Mental Health Association Presentation  Participation Level: Active  Participation Quality: Attentive  Affect: Appropriate  Cognitive: Oriented  Insight: Developing/Improving  Engagement in Therapy: Engaged  Modes of Intervention: Discussion, Education and Socialization  Summary of Progress/Problems: Mental Health Association (MHA) Speaker came to talk about his personal journey with substance abuse and addiction. The pt processed ways by which to relate to the speaker. MHA speaker provided handouts and educational information pertaining to groups and services offered by the MHA. Pt was engaged in speaker's presentation and was receptive to resources provided.    Issa Luster Carter, LCSWA 04/19/2015 1:32 PM  

## 2015-04-20 DIAGNOSIS — F332 Major depressive disorder, recurrent severe without psychotic features: Secondary | ICD-10-CM | POA: Insufficient documentation

## 2015-04-20 LAB — COMPREHENSIVE METABOLIC PANEL
ALT: 177 U/L — ABNORMAL HIGH (ref 17–63)
AST: 75 U/L — ABNORMAL HIGH (ref 15–41)
Albumin: 4.6 g/dL (ref 3.5–5.0)
Alkaline Phosphatase: 62 U/L (ref 38–126)
Anion gap: 8 (ref 5–15)
BILIRUBIN TOTAL: 0.6 mg/dL (ref 0.3–1.2)
BUN: 13 mg/dL (ref 6–20)
CO2: 29 mmol/L (ref 22–32)
CREATININE: 1.1 mg/dL (ref 0.61–1.24)
Calcium: 9.2 mg/dL (ref 8.9–10.3)
Chloride: 102 mmol/L (ref 101–111)
GFR calc Af Amer: >60 mL/min (ref >60)
GFR calc non Af Amer: >60 mL/min (ref >60)
Glucose, Bld: 98 mg/dL (ref 65–99)
Potassium: 3.9 mmol/L (ref 3.5–5.1)
Sodium: 139 mmol/L (ref 135–145)
TOTAL PROTEIN: 7.3 g/dL (ref 6.5–8.1)

## 2015-04-20 LAB — CBC WITH DIFFERENTIAL/PLATELET
Basophils Absolute: 0 10*3/uL (ref 0.0–0.1)
Basophils Relative: 0 % (ref 0–1)
EOS ABS: 0.3 10*3/uL (ref 0.0–0.7)
EOS PCT: 2 % (ref 0–5)
HCT: 48.6 % (ref 39.0–52.0)
HEMOGLOBIN: 17.5 g/dL — AB (ref 13.0–17.0)
Lymphocytes Relative: 51 % — ABNORMAL HIGH (ref 12–46)
Lymphs Abs: 6 10*3/uL — ABNORMAL HIGH (ref 0.7–4.0)
MCH: 31.5 pg (ref 26.0–34.0)
MCHC: 36 g/dL (ref 30.0–36.0)
MCV: 87.4 fL (ref 78.0–100.0)
Monocytes Absolute: 0.8 10*3/uL (ref 0.1–1.0)
Monocytes Relative: 6 % (ref 3–12)
Neutro Abs: 4.8 10*3/uL (ref 1.7–7.7)
Neutrophils Relative %: 41 % — ABNORMAL LOW (ref 43–77)
Platelets: 282 10*3/uL (ref 150–400)
RBC: 5.56 MIL/uL (ref 4.22–5.81)
RDW: 12.2 % (ref 11.5–15.5)
WBC: 11.9 10*3/uL — ABNORMAL HIGH (ref 4.0–10.5)

## 2015-04-20 LAB — HEPATITIS B SURFACE ANTIGEN: Hepatitis B Surface Ag: NEGATIVE

## 2015-04-20 LAB — PROTIME-INR
INR: 0.98 (ref 0.00–1.49)
Prothrombin Time: 13.2 seconds (ref 11.6–15.2)

## 2015-04-20 LAB — HEPATITIS C ANTIBODY: HCV Ab: <0.1 s/co ratio (ref 0.0–0.9)

## 2015-04-20 LAB — GAMMA GT: GGT: 41 U/L (ref 7–50)

## 2015-04-20 LAB — T3, FREE: T3 FREE: 3.7 pg/mL (ref 2.0–4.4)

## 2015-04-20 MED ORDER — TRAZODONE HCL 50 MG PO TABS: 50.0000 mg | ORAL_TABLET | Freq: Every evening | ORAL | Status: DC | PRN

## 2015-04-20 MED ORDER — HYDROXYZINE HCL 25 MG PO TABS: 25.0000 mg | ORAL_TABLET | Freq: Four times a day (QID) | ORAL | Status: DC | PRN

## 2015-04-20 MED ORDER — CITALOPRAM HYDROBROMIDE 20 MG PO TABS: 20.0000 mg | ORAL_TABLET | Freq: Every day | ORAL | Status: DC

## 2015-04-20 NOTE — Progress Notes (Signed)
  [facility] Adult Case Management Discharge Plan :  Will you be returning to the same living situation after discharge:  Yes,  Pt returning home to parent's house At discharge, do you have transportation home?: Yes,  brother to provide transportation Do you have the ability to pay for your medications: Yes,  Pt provided with supply and prescriptions  Release of information consent forms completed and in the chart;  Patient's signature needed at discharge.  Patient to Follow up at: Follow-up Information    Follow up with [facility].   Specialty:  Behavioral Health   Why:  Please walk-in between 8am-3pm Monday-Friday to be seen by a doctor and therapist. It is suggested that you arrive early to avoid long wait times.   Contact information:   [address] [place] [address] [phone]       Patient denies SI/HI: Yes,  Pt denies    Safety Planning and Suicide Prevention discussed: Yes,  with Pt. Contact attempts made but were unsuccessful. See SEP note for further detail.  Have you used any form of tobacco in the last 30 days? (Cigarettes, Smokeless Tobacco, Cigars, and/or Pipes): Yes  Has patient been referred to the Quitline?: Patient refused referral  Elaina Hoops 04/20/2015, 10:17 AM

## 2015-04-20 NOTE — [facility] Suicide Risk Assessment (Deleted)
[facility] INPATIENT:  Family/Significant Other Suicide Prevention Education  Suicide Prevention Education:  Contact Attempts: Berdie Ogren, Pt's friend [phone], (name of family member/significant other) has been identified by the patient as the family member/significant other with whom the patient will be residing, and identified as the person(s) who will aid the patient in the event of a mental health crisis.  With written consent from the patient, two attempts were made to provide suicide prevention education, prior to and/or following the patient's discharge.  We were unsuccessful in providing suicide prevention education.  A suicide education pamphlet was given to the patient to share with family/significant other.  Date and time of first attempt: 04/19/15 @ 4:00pm Date and time of second attempt:04/20/15 @ 8:45am  Elaina Hoops 04/20/2015, 8:46 AM

## 2015-04-20 NOTE — Progress Notes (Signed)
D/w Dr. Jama Flavors  PAtient is anxious to go home and hence no [place] of liver as yet He is completely asymptomatic from a fever or pain standpoint  I would recommend as OP at [facility] consideration in ~ 1-2 weeks  For the following labs  INR/CMET/Aldolase/CK/CBC  We appreciate this consult ans I am available personally for any further q's?  Pleas Koch, MD [facility] [phone]

## 2015-04-20 NOTE — Progress Notes (Signed)
Patient ID: Maurice Freeman, male   DOB: 1996/03/19, 19 y.o.   MRN: [id number] D: Client visible on the unit, in dayroom watching TV, interacts appropriately with staff and peers. Client reports admission has been helpful "getting away from parents and work" Client reports he has learned coping skills to deal with stressors "step away" "breathe through it" "at work as the boss for a minute, step away and get a drink of water or something" client reports getting advice from SW about resources has been helpful also. A: Writer provided emotional support, encouraged client to use coping skills and continue medications regime. Staff will monitor q92min for safety. R: Client is safe on the unit, attended karaoke and sang.

## 2015-04-20 NOTE — [facility] Suicide Risk Assessment (Signed)
[facility] INPATIENT:  Family/Significant Other Suicide Prevention Education  Suicide Prevention Education:  Education Completed; Berdie Ogren, Pt's friend [phone],  (name of family member/significant other) has been identified by the patient as the family member/significant other with whom the patient will be residing, and identified as the person(s) who will aid the patient in the event of a mental health crisis (suicidal ideations/suicide attempt).  With written consent from the patient, CSW called to provide suicide prevention education,and offered this education to Pt's friend. Pt's friend verbalized understanding of SPE and stated that did not feel that he needed to have it reviewed again. SPE reviewed with patient and brochure provided. Patient encouraged to return to hospital if having suicidal thoughts, patient verbalized his/her understanding and has no further questions at this time.    Elaina Hoops 04/20/2015, 1:22 PM

## 2015-04-20 NOTE — Plan of Care (Signed)
Problem: Alteration in mood Goal: LTG-Pt's behavior demonstrates decreased signs of depression 04/18/2015: Goal not met. Pt was admitted with symptoms of depression, rating 8/10. Pt continues to present with flat affect and depressive symptoms. Pt will demonstrate decreased symptoms of depression and rate depression at 3/10 or lower prior to discharge.   Peri Maris, LCSWA Clinical Social Worker [phone]   (Patient's behavior demonstrates decreased signs of depression to the point the patient is safe to return home and continue treatment in an outpatient setting)  Outcome: Progressing Client reports "0" of 10 symptoms of depression AEB smiling, interacting appropriately with staff and peers.

## 2015-04-20 NOTE — Discharge Summary (Signed)
Physician Discharge Summary Note  Patient:  Maurice Freeman is an 19 y.o., male MRN:  [id number] DOB:  1996-06-18 Patient phone:  [phone] (home)  Patient address:   [address] [place] [address],  Total Time spent with patient: 45 minutes  Date of Admission:  04/18/2015 Date of Discharge: 04/19/2015  Reason for Admission:  Depression  Principal Problem: MDD (major depressive disorder), recurrent episode, severe Discharge Diagnoses: Patient Active Problem List   Diagnosis Date Noted  . Major depressive disorder, recurrent, severe without psychotic features [F33.2]   . MDD (major depressive disorder), recurrent episode, severe [F33.2] 04/18/2015    Musculoskeletal: Strength & Muscle Tone: within normal limits Gait & Station: normal Patient leans: N/A  Psychiatric Specialty Exam: Physical Exam  Vitals reviewed.   Review of Systems  Constitutional: Negative for fever.  Cardiovascular: Negative for chest pain.  Genitourinary: Negative for dysuria.  Psychiatric/Behavioral: Negative for depression and suicidal ideas.  All other systems reviewed and are negative.   Blood pressure 117/65, pulse 67, temperature 98.5 F (36.9 C), temperature source Oral, resp. rate 12, height 5' 7"  (1.702 m), weight 101.152 kg (223 lb).Body mass index is 34.92 kg/(m^2).     Have you used any form of tobacco in the last 30 days? (Cigarettes, Smokeless Tobacco, Cigars, and/or Pipes): Yes  Has this patient used any form of tobacco in the last 30 days? (Cigarettes, Smokeless Tobacco, Cigars, and/or Pipes) N/A  Past Medical History:  Past Medical History  Diagnosis Date  . Medical history non-contributory     Past Surgical History  Procedure Laterality Date  . No past surgeries     Family History: History reviewed. No pertinent family history. Social History:  History  Alcohol Use  . Yes    Comment: once a week     History  Drug Use No    History   Social  History  . Marital Status: Single    Spouse Name: N/A  . Number of Children: N/A  . Years of Education: N/A   Social History Main Topics  . Smoking status: Current Some Day Smoker -- 0.25 packs/day  . Smokeless tobacco: Not on file  . Alcohol Use: Yes     Comment: once a week  . Drug Use: No  . Sexual Activity: Not on file   Other Topics Concern  . 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 He stated he has chronic depression, which started about 8 years ago.  He reports passive suicidal thoughts but recently worsened and he decided to seek help.  He reports a history of cutting.  He also describes anxiety, with some social anxiety, phobia.   Maurice Freeman was admitted for MDD (major depressive disorder), recurrent episode, severe and crisis management.  He was treated discharged with the medications listed below under Medication List.  Medical problems were identified and treated as needed.  Home medications were restarted as appropriate.  Improvement was monitored by observation and [facility] daily report of symptom reduction.  Emotional and mental  status was monitored by daily self-inventory reports completed by [facility] and clinical staff.         Maurice Freeman was evaluated by the treatment team for stability and plans for continued recovery upon discharge.  Maurice Freeman motivation was an integral factor for scheduling further treatment.  Employment, transportation, bed availability, health status, family support, and any pending legal  issues were also considered during his hospital stay.  He was offered further treatment options upon discharge including but not limited to Residential, Intensive Outpatient, and Outpatient treatment.  Maurice Freeman will follow up with the services as listed below under Follow Up Information.     Upon completion of this admission the patient was both mentally and medically stable for discharge denying suicidal/homicidal ideation, auditory/visual/tactile hallucinations, delusional thoughts and paranoia.      Consults:  psychiatry  Significant Diagnostic Studies:  labs: per ED  Discharge Vitals:   Blood pressure 117/65, pulse 67, temperature 98.5 F (36.9 C), temperature source Oral, resp. rate 12, height 5' 7"  (1.702 m), weight 101.152 kg (223 lb). Body mass index is 34.92 kg/(m^2). Lab Results:   Results for orders placed or performed during the hospital encounter of 04/18/15 (from the past 72 hour(s))  Urine rapid drug screen (hosp performed)     Status: None   Collection Time: 04/18/15  6:20 AM  Result Value Ref Range   Opiates NONE DETECTED NONE DETECTED   Cocaine NONE DETECTED NONE DETECTED   Benzodiazepines NONE DETECTED NONE DETECTED   Amphetamines NONE DETECTED NONE DETECTED   Tetrahydrocannabinol NONE DETECTED NONE DETECTED   Barbiturates NONE DETECTED NONE DETECTED    Comment:        DRUG SCREEN FOR MEDICAL PURPOSES ONLY.  IF CONFIRMATION IS NEEDED FOR ANY PURPOSE, NOTIFY LAB WITHIN 5 DAYS.        LOWEST DETECTABLE LIMITS FOR URINE DRUG SCREEN Drug Class       Cutoff (ng/mL) Amphetamine      1000 Barbiturate      200 Benzodiazepine   858 Tricyclics       850 Opiates          300 Cocaine          300 THC              50 Performed at [facility]   TSH     Status: Abnormal   Collection Time: 04/18/15  6:24 AM  Result Value Ref Range   TSH 5.784 (H) 0.350 - 4.500 uIU/mL    Comment: Performed at [facility]  Hepatitis  panel, acute     Status: None   Collection Time: 04/18/15  6:24 AM  Result Value Ref Range   Hepatitis B Surface Ag Negative Negative   HCV Ab <0.1 0.0 - 0.9 s/co ratio    Comment: (NOTE)                                  Negative:     < 0.8                             Indeterminate: 0.8 - 0.9                                  Positive:     >  0.9 The CDC recommends that a positive HCV antibody result be followed up with a HCV Nucleic Acid Amplification test (235573). Performed At: [facility] [place], [place] [id number] Lindon Romp MD [id number]    Hep A IgM Negative Negative   Hep B C IgM Negative Negative    Comment: Performed at [facility]  T4, free     Status: None   Collection Time: 04/19/15  6:25 AM  Result Value Ref Range   Free T4 0.99 0.61 - 1.12 ng/dL    Comment: Performed at [facility]  T3, free     Status: None   Collection Time: 04/19/15  6:25 AM  Result Value Ref Range   T3, Free 3.7 2.0 - 4.4 pg/mL    Comment: (NOTE) Performed At: [facility] [place], [place] [id number] Lindon Romp MD [id number] Performed at [facility]   Hepatic function panel     Status: Abnormal   Collection Time: 04/19/15  6:25 AM  Result Value Ref Range   Total Protein 7.1 6.5 - 8.1 g/dL   Albumin 4.2 3.5 - 5.0 g/dL   AST 72 (H) 15 - 41 U/L   ALT 173 (H) 17 - 63 U/L   Alkaline Phosphatase 61 38 - 126 U/L   Total Bilirubin 0.8 0.3 - 1.2 mg/dL   Bilirubin, Direct 0.1 0.1 - 0.5 mg/dL   Indirect Bilirubin 0.7 0.3 - 0.9 mg/dL    Comment: Performed at [facility]  Hepatitis B surface antigen     Status: None   Collection Time: 04/19/15  6:25 AM  Result Value Ref Range   Hepatitis B Surface Ag Negative Negative    Comment: (NOTE) Performed At: [facility] [place], [place] [id number] Lindon Romp MD [id number] Performed at  [facility]   Hepatitis C antibody     Status: None   Collection Time: 04/19/15  6:25 AM  Result Value Ref Range   HCV Ab <0.1 0.0 - 0.9 s/co ratio    Comment: (NOTE)                                  Negative:     < 0.8                             Indeterminate: 0.8 - 0.9                                  Positive:     > 0.9 The CDC recommends that a positive HCV antibody result be followed up with a HCV Nucleic Acid Amplification test (381017). Performed At: [facility] [place], [place] [id number] Lindon Romp MD [id number] Performed at [facility]   Comprehensive metabolic panel     Status: Abnormal   Collection Time: 04/20/15  6:21 AM  Result Value Ref Range   Sodium 139 135 - 145 mmol/L   Potassium 3.9 3.5 - 5.1 mmol/L   Chloride 102 101 - 111 mmol/L   CO2 29 22 - 32 mmol/L   Glucose, Bld 98 65 - 99 mg/dL   BUN 13 6 - 20 mg/dL   Creatinine, Ser 1.10 0.61 - 1.24  mg/dL   Calcium 9.2 8.9 - 10.3 mg/dL   Total Protein 7.3 6.5 - 8.1 g/dL   Albumin 4.6 3.5 - 5.0 g/dL   AST 75 (H) 15 - 41 U/L   ALT 177 (H) 17 - 63 U/L   Alkaline Phosphatase 62 38 - 126 U/L   Total Bilirubin 0.6 0.3 - 1.2 mg/dL   GFR calc non Af Amer >60 >60 mL/min   GFR calc Af Amer >60 >60 mL/min    Comment: (NOTE) The eGFR has been calculated using the CKD EPI equation. This calculation has not been validated in all clinical situations. eGFR's persistently <60 mL/min signify possible Chronic Kidney Disease.    Anion gap 8 5 - 15    Comment: Performed at [facility]     Status: None   Collection Time: 04/20/15  6:21 AM  Result Value Ref Range   Prothrombin Time 13.2 11.6 - 15.2 seconds   INR 0.98 0.00 - 1.49    Comment: Performed at [facility]  CBC with Differential/Platelet     Status: Abnormal   Collection Time: 04/20/15  6:21 AM  Result Value Ref Range   WBC 11.9 (H) 4.0 - 10.5  K/uL   RBC 5.56 4.22 - 5.81 MIL/uL   Hemoglobin 17.5 (H) 13.0 - 17.0 g/dL   HCT 48.6 39.0 - 52.0 %   MCV 87.4 78.0 - 100.0 fL   MCH 31.5 26.0 - 34.0 pg   MCHC 36.0 30.0 - 36.0 g/dL   RDW 12.2 11.5 - 15.5 %   Platelets 282 150 - 400 K/uL   Neutrophils Relative % 41 (L) 43 - 77 %   Neutro Abs 4.8 1.7 - 7.7 K/uL   Lymphocytes Relative 51 (H) 12 - 46 %   Lymphs Abs 6.0 (H) 0.7 - 4.0 K/uL   Monocytes Relative 6 3 - 12 %   Monocytes Absolute 0.8 0.1 - 1.0 K/uL   Eosinophils Relative 2 0 - 5 %   Eosinophils Absolute 0.3 0.0 - 0.7 K/uL   Basophils Relative 0 0 - 1 %   Basophils Absolute 0.0 0.0 - 0.1 K/uL    Comment: Performed at [facility]     Status: None   Collection Time: 04/20/15  6:21 AM  Result Value Ref Range   GGT 41 7 - 50 U/L    Comment: Performed at [facility]    Physical Findings: AIMS: Facial and Oral Movements Muscles of Facial Expression: None, normal Lips and Perioral Area: None, normal Jaw: None, normal Tongue: None, normal,Extremity Movements Upper (arms, wrists, hands, fingers): None, normal Lower (legs, knees, ankles, toes): None, normal, Trunk Movements Neck, shoulders, hips: None, normal, Overall Severity Severity of abnormal movements (highest score from questions above): None, normal Incapacitation due to abnormal movements: None, normal Patient's awareness of abnormal movements (rate only patient's report): No Awareness, Dental Status Current problems with teeth and/or dentures?: No Does patient usually wear dentures?: No  CIWA:    COWS:      See Psychiatric Specialty Exam and Suicide Risk Assessment completed by Attending Physician prior to discharge.  Discharge destination:  Home  Is patient on multiple antipsychotic therapies at discharge:  No   Has Patient had three or more failed trials of antipsychotic monotherapy by history:  No  Recommended Plan for Multiple Antipsychotic Therapies: NA      Medication List    STOP taking these medications  FISH OIL PO     ibuprofen 200 MG tablet  Commonly known as:  ADVIL,MOTRIN     multivitamin with minerals Tabs tablet     terbinafine 1 % cream  Commonly known as:  LAMISIL AT     VITAMIN C PO      TAKE these medications      Indication   citalopram 20 MG tablet  Commonly known as:  CELEXA  Take 1 tablet (20 mg total) by mouth daily.   Indication:  Depression     hydrOXYzine 25 MG tablet  Commonly known as:  ATARAX/VISTARIL  Take 1 tablet (25 mg total) by mouth every 6 (six) hours as needed for anxiety.   Indication:  Anxiety Neurosis     traZODone 50 MG tablet  Commonly known as:  DESYREL  Take 1 tablet (50 mg total) by mouth at bedtime and may repeat dose one time if needed.   Indication:  Trouble Sleeping       Follow-up Information    Follow up with [facility].   Specialty:  Behavioral Health   Why:  Please walk-in between 8am-3pm Monday-Friday to be seen by a doctor and therapist. It is suggested that you arrive early to avoid long wait times.   Contact information:   [place] [address] [phone]       Follow-up recommendations:  Activity:  as tol, diet as tol  Comments:  1.  Take all your medications as prescribed.              2.  Report any adverse side effects to outpatient provider.                       3.  Patient instructed to not use alcohol or illegal drugs while on prescription medicines.            4.  In the event of worsening symptoms, instructed patient to call 911, the crisis hotline or go to nearest emergency room for evaluation of symptoms.  Total Discharge Time:  40 min  Signed: Freda Munro May Agustin AGNP-BC 04/20/2015, 1:20 PM   Patient seen, Suicide Assessment Completed.  Disposition Plan Reviewed

## 2015-04-20 NOTE — Progress Notes (Signed)
D/C instructions/meds/follow-up appointments reviewed, pt verbalized understanding, pt's belongings returned to pt, samples given. 

## 2015-04-20 NOTE — BHH Group Notes (Signed)
[facility] LCSW Aftercare Discharge Planning Group Note  04/20/2015 8:45 AM  Participation Quality: Alert, Appropriate and Oriented  Mood/Affect: Appropriate  Depression Rating: 0  Anxiety Rating: 1  Thoughts of Suicide: Pt denies SI/HI  Will you contract for safety? Yes  Current AVH: Pt denies  Plan for Discharge/Comments: Pt attended discharge planning group and actively participated in group. CSW discussed suicide prevention education with the group and encouraged them to discuss discharge planning and any relevant barriers. Pt reports much improved mood and expresses feeling ready for discharge.   Transportation Means: Pt reports access to transportation  Supports: No supports mentioned at this time  Chad Cordial, LCSWA 04/20/2015 10:14 AM

## 2015-04-20 NOTE — Progress Notes (Signed)
Recreation Therapy Notes  Date: 07.22.16 Time: 9:30 am Location: [address] Group Room  Group Topic: Stress Management  Goal Area(s) Addresses:  Patient will verbalize importance of using healthy stress management.  Patient will identify positive emotions associated with healthy stress management.   Intervention: Stress Management   Activity :  Guided Imagery Script.  LRT introduced and educated patients on stress management  Technique of guided imagery.  A script was used to deliver the technique to patients.  Patients were asked to follow script read a loud by LRT to engage in practicing the stress management technique.  Education:  Stress Management, Discharge Planning.   Education Outcome: Acknowledges edcuation/In group clarification offered/Needs additional education  Clinical Observations/Feedback: Patient did not attend group.   Saida Lonon, LRT/CTRS         Chenoah Mcnally A 04/20/2015 3:31 PM 

## 2015-04-20 NOTE — BHH Suicide Risk Assessment (Signed)
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 rate 12, height 5\' 7"  (1.702 m), weight 223 lb (101.152 kg).Body mass index is 34.92 kg/(m^2).  General Appearance: Well Groomed  Patent attorney::  Good  Speech:  Normal Rate409  Volume:  Normal  Mood:  Euthymic  Affect:  Appropriate and reactive   Thought Process:  Goal Directed and Linear  Orientation:  Full (Time, Place, and Person)  Thought Content:  no hallucinations, no delusions   Suicidal Thoughts:  No- at this time patient denies any self injurious ideations, no SI  Homicidal Thoughts:  No  Memory:  recent and remote grossly intact   Judgement:  Other:  improved  Insight:  improving   Psychomotor Activity:  Normal  Concentration:  Good  Recall:  Good  Fund of Knowledge:Good  Language: Good  Akathisia:  Negative  Handed:  Right  AIMS (if indicated):     Assets:  Communication Skills Desire for Improvement Physical Health Resilience  Sleep:  Number of Hours: 6.75  Cognition: WNL  ADL's:  Improved    Have you used any form of tobacco in the last 30 days? (Cigarettes, Smokeless Tobacco, Cigars, and/or Pipes): Yes  Has this patient used any form of tobacco in the last 30 days? (Cigarettes, Smokeless Tobacco, Cigars, and/or Pipes) Yes, A prescription for an FDA-approved tobacco cessation medication was offered at discharge and the patient refused  Mental Status Per Nursing Assessment::   On Admission:     Current Mental Status by Physician: At this time patient is much better than when he was admitted. 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   Historical Factors: Reports long history of depression, no prior episodes of treatment  Risk Reduction Factors:   Sense of responsibility to family, Positive social support and Positive coping skills or problem solving skills  Continued Clinical Symptoms:  At this time much improved - has tolerated medication ( Celexa ) well.  No ongoing self injurious or suicidal ideations and antidepressant management has not been associated with increased SI or activation. Of note, patient has elevated transaminases- etiology unknown at this time- viral hepatitis work up negative / PT/INR WNL.  Denies alcohol dependence , and GGTP WNL. Patient has no symptoms. We have reviewed the importance of following up as outpatient regarding this issue, and he expresses understanding. Hospitalist recommended ultrasound, but patient prefers discharge and to follow up as outpatient  For ongoing diagnostic studies .  Cognitive Features That Contribute To Risk:  No gross cognitive deficits noted upon discharge. Is alert , attentive, and oriented x 3   Suicide Risk:  Mild:  Suicidal ideation of limited frequency, intensity, duration, and specificity.  There are no identifiable plans, no associated intent, mild dysphoria and related symptoms, good self-control (both objective and subjective assessment), few other risk factors, and identifiable  protective factors, including available and accessible social support.  Principal Problem: MDD (major depressive disorder), recurrent episode, severe Discharge Diagnoses:  Patient Active Problem List   Diagnosis Date Noted  . MDD (major depressive disorder), recurrent episode, severe  [F33.2] 04/18/2015    Follow-up Information    Follow up with [facility].   Specialty:  Behavioral Health   Why:  Please walk-in between 8am-3pm Monday-Friday to be seen by a doctor and therapist. It is suggested that you arrive early to avoid long wait times.   Contact information:   [address] [place] [address] [phone]       Plan Of Care/Follow-up recommendations:  Activity:  as tolerated Diet:  Regular Tests:  NA Other:  See below   Is patient on multiple antipsychotic therapies at discharge:  No   Has Patient had three or more failed trials of antipsychotic monotherapy by history:  No  Recommended Plan for Multiple Antipsychotic Therapies: NA  Patient is being discharged in good spirits. He plans to live with brother for the time being . Follow up at [facility]. Encouraged to follow up with PCP regarding elevated transaminases or go to ED if any acute symptoms-  Encouraged to go to [facility]   [place], [place] 04/20/2015, 10:14 AM

## 2015-04-20 NOTE — Progress Notes (Signed)
D:  Per pt self inventory pt reports sleeping good, appetite good, energy level normal, ability to pay attention good, rates depression at a 0 out of 10, hopelessness at a 0 out of 10, anxiety at a 1 out of 10, bright during interaction, states that he is feeling better today, goal today: "accomplishing my discharge plan"     A:  Emotional support provided, Encouraged pt to continue with treatment plan and attend all group activities, q15 min checks maintained for safety.  R:  Pt is receptive, going to groups, pleasant and cooperative with staff and other patients on the unit.

## 2019-08-22 ENCOUNTER — Other Ambulatory Visit: Payer: Self-pay

## 2019-08-22 DIAGNOSIS — Z20822 Contact with and (suspected) exposure to covid-19: Secondary | ICD-10-CM

## 2019-08-23 LAB — NOVEL CORONAVIRUS, NAA: SARS-CoV-2, NAA: NOT DETECTED

## 2019-11-11 ENCOUNTER — Encounter: Payer: Self-pay | Admitting: Podiatry

## 2019-11-11 ENCOUNTER — Other Ambulatory Visit: Payer: Self-pay | Admitting: Podiatry

## 2019-11-11 ENCOUNTER — Ambulatory Visit (INDEPENDENT_AMBULATORY_CARE_PROVIDER_SITE_OTHER): Payer: 59 | Admitting: Podiatry

## 2019-11-11 ENCOUNTER — Other Ambulatory Visit: Payer: Self-pay

## 2019-11-11 ENCOUNTER — Ambulatory Visit (INDEPENDENT_AMBULATORY_CARE_PROVIDER_SITE_OTHER): Payer: 59

## 2019-11-11 DIAGNOSIS — M25571 Pain in right ankle and joints of right foot: Secondary | ICD-10-CM

## 2019-11-11 DIAGNOSIS — M778 Other enthesopathies, not elsewhere classified: Secondary | ICD-10-CM | POA: Diagnosis not present

## 2019-11-11 DIAGNOSIS — M25371 Other instability, right ankle: Secondary | ICD-10-CM

## 2019-11-11 DIAGNOSIS — Z87828 Personal history of other (healed) physical injury and trauma: Secondary | ICD-10-CM

## 2019-11-14 ENCOUNTER — Encounter: Payer: Self-pay | Admitting: Podiatry

## 2019-11-14 NOTE — Progress Notes (Signed)
Subjective:  Patient ID: Maurice Freeman, male    DOB: 06/10/1996,  MRN: [id number]  Chief Complaint  Patient presents with  . Ankle Pain    pt is here for right ankle pain, located on the lateral side of his right ankle, pain has been going on for about 5 years, pain is elevated when applying pressure. pt states that he has tried stretching exercises, but not much has helped.    24 y.o. male presents with the above complaint.  Patient presents with right ankle pain on the lateral side.  Patient states that this has been going on for about 5+ year year.  Patient states he constantly has a feeling that his ankle has been a give out.  Patient has a history of multiple ankle sprains in the past since he was a kid.  He always felt that the pain is elevated when walking.  Patient has tried stretching exercises and physical therapy but has not helped much.  His pain scale 7 out of 10 has progressively gotten worse with time.  He would like to know if there is any kind of bracing management that could be done.  He denies any other acute complaints.  Review of Systems: Negative except as noted in the HPI. Denies N/V/F/Ch.  Past Medical History:  Diagnosis Date  . Medical history non-contributory     Current Outpatient Medications:  .  citalopram (CELEXA) 20 MG tablet, Take 1 tablet (20 mg total) by mouth daily., Disp: 30 tablet, Rfl: 0 .  hydrOXYzine (ATARAX/VISTARIL) 25 MG tablet, Take 1 tablet (25 mg total) by mouth every 6 (six) hours as needed for anxiety., Disp: 30 tablet, Rfl: 0 .  traZODone (DESYREL) 50 MG tablet, Take 1 tablet (50 mg total) by mouth at bedtime and may repeat dose one time if needed., Disp: 30 tablet, Rfl: 0  Social History   Tobacco Use  Smoking Status Current Some Day Smoker  . Packs/day: 0.25    No Known Allergies Objective:  There were no vitals filed for this visit. There is no height or weight on file to calculate BMI. Constitutional Well  developed. Well nourished.  Vascular Dorsalis pedis pulses palpable bilaterally. Posterior tibial pulses palpable bilaterally. Capillary refill normal to all digits.  No cyanosis or clubbing noted. Pedal hair growth normal.  Neurologic Normal speech. Oriented to person, place, and time. Epicritic sensation to light touch grossly present bilaterally.  Dermatologic Nails well groomed and normal in appearance. No open wounds. No skin lesions.  Orthopedic:  Pain on palpation to the right ATFL ligament.  Pain with inversion of the foot active and passive ankle joint.  Negative anterior drawer sign or talar tilt.  No pain on peroneal tendon, posterior tibial tendo, Achilles tendon.   Radiographs: 3 views of skeletally mature adult ankle: Arthritic changes noted at the anterior aspect of the ankle joint.No other bony abnormalities identified.  No arthritic changes of the subtalar joint.  No loose osteophytes noted subfibular. Assessment:   1. Right ankle pain, unspecified chronicity   2. Ankle instability, right   3. History of ankle sprain    Plan:  Patient was evaluated and treated and all questions answered.  Right chronic ankle instability -I explained to the patient the etiology of ankle instability with his history of multiple ankle sprains and various treatment options associated with it.  Given that patient has moderate to severe pain to the right right ankle.  I believe patient will benefit from a steroid injection  in that area to help decrease the acute inflammation.  Patient agrees with the plan would like to proceed with a steroid injection. -A steroid injection was performed at right lateral foot at a point of maximal tenderness using 1% plain Lidocaine and 10 mg of Kenalog. This was well tolerated. -I also believe patient will benefit from a Tri-Lock ankle brace as this will prevent the ankle from rolling out from underneath his foot while ambulating.  I have asked the patient to  wear it at all times when ambulating.  Patient states understanding will do that. -If patient has relief in pain I will discuss orthotics management during next visit.   No follow-ups on file.

## 2019-12-09 ENCOUNTER — Ambulatory Visit (INDEPENDENT_AMBULATORY_CARE_PROVIDER_SITE_OTHER): Payer: 59 | Admitting: Podiatry

## 2019-12-09 ENCOUNTER — Other Ambulatory Visit: Payer: Self-pay

## 2019-12-09 DIAGNOSIS — M25571 Pain in right ankle and joints of right foot: Secondary | ICD-10-CM | POA: Diagnosis not present

## 2019-12-09 DIAGNOSIS — M25371 Other instability, right ankle: Secondary | ICD-10-CM

## 2019-12-09 DIAGNOSIS — M949 Disorder of cartilage, unspecified: Secondary | ICD-10-CM | POA: Diagnosis not present

## 2019-12-09 DIAGNOSIS — M899 Disorder of bone, unspecified: Secondary | ICD-10-CM

## 2019-12-12 ENCOUNTER — Encounter: Payer: Self-pay | Admitting: Podiatry

## 2019-12-12 NOTE — Progress Notes (Signed)
Subjective:  Patient ID: Maurice Freeman, male    DOB: 04-17-1996,  MRN: [id number]  Chief Complaint  Patient presents with  . Ankle Pain    pt is here for possible right ankle pain.    24 y.o. male presents with the above complaint.  Patient presents with a follow-up on right ankle pain that has been going on for now 5 years.  Patient states is still very painful has not changed.  Patient got new shoes and brace but has not been helping.  He has tried all the conservative therapy but none of that is helping.  There is a concern for osteoarthritic changes at the anterior ankle joint that could also be causing some pain.  He denies any other acute complaints.  He would like to know what the next set of treatment options are.  The steroid injection helped for a couple of days but then the pain came right back.  The Tri-Lock ankle brace also helps for a little bit but does not really get rid of the pain.  Review of Systems: Negative except as noted in the HPI. Denies N/V/F/Ch.  Past Medical History:  Diagnosis Date  . Medical history non-contributory     Current Outpatient Medications:  .  citalopram (CELEXA) 20 MG tablet, Take 1 tablet (20 mg total) by mouth daily., Disp: 30 tablet, Rfl: 0 .  hydrOXYzine (ATARAX/VISTARIL) 25 MG tablet, Take 1 tablet (25 mg total) by mouth every 6 (six) hours as needed for anxiety., Disp: 30 tablet, Rfl: 0 .  traZODone (DESYREL) 50 MG tablet, Take 1 tablet (50 mg total) by mouth at bedtime and may repeat dose one time if needed., Disp: 30 tablet, Rfl: 0  Social History   Tobacco Use  Smoking Status Current Some Day Smoker  . Packs/day: 0.25    No Known Allergies Objective:  There were no vitals filed for this visit. There is no height or weight on file to calculate BMI. Constitutional Well developed. Well nourished.  Vascular Dorsalis pedis pulses palpable bilaterally. Posterior tibial pulses palpable bilaterally. Capillary refill normal to  all digits.  No cyanosis or clubbing noted. Pedal hair growth normal.  Neurologic Normal speech. Oriented to person, place, and time. Epicritic sensation to light touch grossly present bilaterally.  Dermatologic Nails well groomed and normal in appearance. No open wounds. No skin lesions.  Orthopedic:  Pain on palpation to the right ATFL ligament.  Pain with inversion of the foot active and passive ankle joint.  Negative anterior drawer sign or talar tilt.  No pain on peroneal tendon, posterior tibial tendo, Achilles tendon.   Radiographs: None Assessment:   1. Right ankle pain, unspecified chronicity   2. Ankle instability, right   3. Osteochondral lesion of talar dome    Plan:  Patient was evaluated and treated and all questions answered.  Right chronic ankle instability versus osteochondral lesion -Given that patient is not improving clinically after undergoing immobilization with a Tri-Lock ankle brace as well as a steroid injection, I believe patient will need more of an aggressive immobilization with a cam boot..  I believe patient will benefit from a cam boot to help/allow the ligament to heal versus possible concern for osteochondral lesion as well.  No lesion was noted it radiographically however clinically it appears that patient has pain with range of motion of the ankle joint as well as therefore there is a concern for OCD lesion versus generalized osteoarthritic changes. -Cam boot was dispensed -I also believe  patient will benefit from an MRI right ankle to rule out rupture of the ATFL ligament versus evaluate the osteochondral lesion.  Patient will be scheduled to obtain an MRI of the right ankle.   No follow-ups on file.

## 2019-12-13 ENCOUNTER — Telehealth: Payer: Self-pay | Admitting: *Deleted

## 2019-12-13 DIAGNOSIS — M25571 Pain in right ankle and joints of right foot: Secondary | ICD-10-CM

## 2019-12-13 DIAGNOSIS — M25371 Other instability, right ankle: Secondary | ICD-10-CM

## 2019-12-13 DIAGNOSIS — M899 Disorder of bone, unspecified: Secondary | ICD-10-CM

## 2019-12-13 DIAGNOSIS — Z87828 Personal history of other (healed) physical injury and trauma: Secondary | ICD-10-CM

## 2019-12-13 DIAGNOSIS — M949 Disorder of cartilage, unspecified: Secondary | ICD-10-CM

## 2019-12-13 NOTE — Telephone Encounter (Signed)
-----   Message from Candelaria Stagers, DPM sent at 12/12/2019  1:02 PM EDT ----- Regarding: MRI Right Hi Maurice Freeman,  Can you order an MRI of the right ankle for a possible rupture of the anterior talofibular ligament versus osteochondral lesion.  Let me know if you need any other additional diagnosis for this   Thank you  Caryn Bee

## 2019-12-13 NOTE — Telephone Encounter (Signed)
Orders to Dr. Eliane Decree assistant, Timoteo Expose, CMA, faxed to [facility].

## 2019-12-14 ENCOUNTER — Telehealth: Payer: Self-pay

## 2019-12-14 NOTE — Telephone Encounter (Signed)
Maurice Freeman, CMA pre-certed MRI right ankle without contrast - PA: [phone]. PA and orders faxed to [facility].

## 2019-12-31 ENCOUNTER — Ambulatory Visit
Admission: RE | Admit: 2019-12-31 | Discharge: 2019-12-31 | Disposition: A | Discharge status: Home / Self Care | Payer: 59 | Source: Ambulatory Visit | Attending: Podiatry | Admitting: Podiatry

## 2019-12-31 DIAGNOSIS — M899 Disorder of bone, unspecified: Secondary | ICD-10-CM

## 2019-12-31 DIAGNOSIS — M25371 Other instability, right ankle: Secondary | ICD-10-CM

## 2019-12-31 DIAGNOSIS — Z87828 Personal history of other (healed) physical injury and trauma: Secondary | ICD-10-CM

## 2019-12-31 DIAGNOSIS — M25571 Pain in right ankle and joints of right foot: Secondary | ICD-10-CM

## 2020-01-04 ENCOUNTER — Ambulatory Visit (INDEPENDENT_AMBULATORY_CARE_PROVIDER_SITE_OTHER): Payer: 59 | Admitting: Podiatry

## 2020-01-04 ENCOUNTER — Other Ambulatory Visit: Payer: Self-pay

## 2020-01-04 ENCOUNTER — Ambulatory Visit (INDEPENDENT_AMBULATORY_CARE_PROVIDER_SITE_OTHER): Payer: 59

## 2020-01-04 DIAGNOSIS — M25371 Other instability, right ankle: Secondary | ICD-10-CM

## 2020-01-04 DIAGNOSIS — Q6689 Other  specified congenital deformities of feet: Secondary | ICD-10-CM | POA: Diagnosis not present

## 2020-01-04 DIAGNOSIS — M899 Disorder of bone, unspecified: Secondary | ICD-10-CM | POA: Diagnosis not present

## 2020-01-04 DIAGNOSIS — M949 Disorder of cartilage, unspecified: Secondary | ICD-10-CM | POA: Diagnosis not present

## 2020-01-04 DIAGNOSIS — M25571 Pain in right ankle and joints of right foot: Secondary | ICD-10-CM

## 2020-01-04 NOTE — Patient Instructions (Signed)
Pre-Operative Instructions  Congratulations, you have decided to take an important step towards improving your quality of life.  You can be assured that the doctors and staff at [facility] will be with you every step of the way.  Here are some important things you should know:  1. Plan to be at the surgery center/hospital at least 1 (one) hour prior to your scheduled time, unless otherwise directed by the surgical center/hospital staff.  You must have a responsible adult accompany you, remain during the surgery and drive you home.  Make sure you have directions to the surgical center/hospital to ensure you arrive on time. 2. If you are having surgery at [facility] or [facility] hospitals, you will need a copy of your medical history and physical form from your family physician within one month prior to the date of surgery. We will give you a form for your primary physician to complete.  3. We make every effort to accommodate the date you request for surgery.  However, there are times where surgery dates or times have to be moved.  We will contact you as soon as possible if a change in schedule is required.   4. No aspirin/ibuprofen for one week before surgery.  If you are on aspirin, any non-steroidal anti-inflammatory medications (Mobic, Aleve, Ibuprofen) should not be taken seven (7) days prior to your surgery.  You make take Tylenol for pain prior to surgery.  5. Medications - If you are taking daily heart and blood pressure medications, seizure, reflux, allergy, asthma, anxiety, pain or diabetes medications, make sure you notify the surgery center/hospital before the day of surgery so they can tell you which medications you should take or avoid the day of surgery. 6. No food or drink after midnight the night before surgery unless directed otherwise by surgical center/hospital staff. 7. No alcoholic beverages 24-hours prior to surgery.  No smoking 24-hours prior or 24-hours after  surgery. 8. Wear loose pants or shorts. They should be loose enough to fit over bandages, boots, and casts. 9. Don't wear slip-on shoes. Sneakers are preferred. 10. Bring your boot with you to the surgery center/hospital.  Also bring crutches or a walker if your physician has prescribed it for you.  If you do not have this equipment, it will be provided for you after surgery. 11. If you have not been contacted by the surgery center/hospital by the day before your surgery, call to confirm the date and time of your surgery. 12. Leave-time from work may vary depending on the type of surgery you have.  Appropriate arrangements should be made prior to surgery with your employer. 13. Prescriptions will be provided immediately following surgery by your doctor.  Fill these as soon as possible after surgery and take the medication as directed. Pain medications will not be refilled on weekends and must be approved by the doctor. 14. Remove nail polish on the operative foot and avoid getting pedicures prior to surgery. 15. Wash the night before surgery.  The night before surgery wash the foot and leg well with water and the antibacterial soap provided. Be sure to pay special attention to beneath the toenails and in between the toes.  Wash for at least three (3) minutes. Rinse thoroughly with water and dry well with a towel.  Perform this wash unless told not to do so by your physician.  Enclosed: 1 Ice pack (please put in freezer the night before surgery)   1 Hibiclens skin cleaner     Pre-op instructions  If you have any questions regarding the instructions, please do not hesitate to call our office.  [place]: [address], [place], [place] [address] -- [phone]  [place]: [address] [place], [place] [address] -- [phone]  [place]: [address], [place], [place] [address] -- [phone]   Website: [email or web address] 

## 2020-01-05 ENCOUNTER — Telehealth: Payer: Self-pay

## 2020-01-05 NOTE — Telephone Encounter (Signed)
DOS 01/16/20  Ostectomy, excision of tarsal coalition rt - [address]  [facility] EFFECTIVE DATE - 09/30/19  PLAN DEDUCTIBLE - $2000 W/ $1124.60 REMAINING OUT OF POCKET - $5000 W/ $4124.60 REMAINING COPAY $0.00 COINSURANCE - 20%   Notification or Prior Authorization is not required for the requested services  This [employer] plan does not currently require a prior authorization for these services. If you have general questions about the prior authorization requirements, please call us at [phone] or visit [email or web address] > Clinician Resources > Advance and Admission Notification Requirements. The number above acknowledges your notification. Please write this number down for future reference. Notification is not a guarantee of coverage or payment.  Decision ID #:[id number]

## 2020-01-06 ENCOUNTER — Encounter: Payer: Self-pay | Admitting: Podiatry

## 2020-01-06 NOTE — Progress Notes (Signed)
Subjective:  Patient ID: Maurice Freeman, male    DOB: [birth year],  MRN: [id number]  Chief Complaint  Patient presents with  . Ankle Pain    pt is here for a f/u of right ankle pain, pt states that he is feeling the same as was last time, pain has not changed, pt also states that the cam boot has not helped as well, pain scale is a 7 out of 10 on the pain scale.    24 y.o. male presents with the above complaint.  Patient presents with a follow-up of right ankle pain.  Patient states the pain has not gone down at all.  The bracing the injection none of that has helped so far.  Patient had his MRI done and is here to be evaluated and discuss for possible surgical intervention.  Patient has been wearing his cam boot which has not helped either.  He denies any other acute complaints.  Given that he has failed all conservative therapy I believe patient will benefit from a surgical intervention based on the MRI findings.  I will discuss this with the patient.  Review of Systems: Negative except as noted in the HPI. Denies N/V/F/Ch.  Past Medical History:  Diagnosis Date  . Medical history non-contributory     Current Outpatient Medications:  .  citalopram (CELEXA) 20 MG tablet, Take 1 tablet (20 mg total) by mouth daily., Disp: 30 tablet, Rfl: 0 .  hydrOXYzine (ATARAX/VISTARIL) 25 MG tablet, Take 1 tablet (25 mg total) by mouth every 6 (six) hours as needed for anxiety., Disp: 30 tablet, Rfl: 0 .  traZODone (DESYREL) 50 MG tablet, Take 1 tablet (50 mg total) by mouth at bedtime and may repeat dose one time if needed., Disp: 30 tablet, Rfl: 0  Social History   Tobacco Use  Smoking Status Current Some Day Smoker  . Packs/day: 0.25    No Known Allergies Objective:  There were no vitals filed for this visit. There is no height or weight on file to calculate BMI. Constitutional Well developed. Well nourished.  Vascular Dorsalis pedis pulses palpable bilaterally. Posterior tibial  pulses palpable bilaterally. Capillary refill normal to all digits.  No cyanosis or clubbing noted. Pedal hair growth normal.  Neurologic Normal speech. Oriented to person, place, and time. Epicritic sensation to light touch grossly present bilaterally.  Dermatologic Nails well groomed and normal in appearance. No open wounds. No skin lesions.  Orthopedic:  Pain on palpation to the right ATFL ligament.  Pain with inversion of the foot active and passive ankle joint.  Pain around the sinus tarsi as well as generalized pain around the ankle.  Pain on the palpation to the medial side underneath the medial malleolus.  Limited range of motion noted of the subtalar joint with active and passive range of motion.  Negative anterior drawer sign or talar tilt.  No pain on peroneal tendon, posterior tibial tendo, Achilles tendon.  Gait examination showed mild calcaneal valgus able to recreate the arch with dorsiflexion of the hallux.  Upon double and single heel raise unable to invert the heel.  The heel remains rectus.   Radiographs: 1. Significant reactive marrow edema around the posterior talocalcaneal facet with evidence of coalition posteromedially. Possible associated small subchondral stress fracture involving the calcaneus. 2. Intact medial and lateral ankle ligaments and tendons. 3. No other significant bony findings  Right os calcis views: Medial aspect of the middle facet coalition noted appears to be osseous in nature.  No other  arthritic changes noted at the subtalar joint.  No other bony abnormalities noted. Assessment:   1. Right ankle pain, unspecified chronicity   2. Ankle instability, right   3. Tarsal coalition of right foot    Plan:  Patient was evaluated and treated and all questions answered.  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  Upon my review of the MRI it does not appear that there is extensive arthritic changes and given the patient of the age I will hold off on subtalar joint fusion.  I believe patient will benefit from middle facet coalition resection and my hope will be to allow adequate subtalar joint range of motion and therefore decrease patient's pain.  I discussed my surgical options with the patient in extensive detail.  Patient states understanding would like to proceed with the surgery.  I plan on using a medial incision and I extensively discussed with the patient that I will be encountering a major nerve in the artery to the foot and all the various complications were discussed extensively with the patient. -I discussed my postop protocol with the patient in extensive detail which includes nonweightbearing followed by weightbearing as tolerated with the boot.  I encouraged patient to obtain knee scooter. -Informed surgical risk consent was reviewed and read aloud to the patient.  I reviewed the films.  I have discussed my findings with the patient in great detail.  I have discussed all risks including but not limited to infection, stiffness, scarring, limp, disability, deformity, damage to blood vessels and nerves, numbness, poor healing, need for braces, arthritis, chronic pain, amputation, death.  All benefits and realistic expectations discussed in great detail.  I have made no promises as to the outcome.  I have provided realistic expectations.  I have offered the patient a 2nd opinion, which they have declined and assured me they preferred to proceed despite the risks     No follow-ups on file.

## 2020-01-16 DIAGNOSIS — Q6689 Other  specified congenital deformities of feet: Secondary | ICD-10-CM

## 2020-01-16 MED ORDER — IBUPROFEN 800 MG PO TABS: 800.0000 mg | ORAL_TABLET | Freq: Four times a day (QID) | ORAL | 1 refills | Status: AC | PRN

## 2020-01-16 MED ORDER — OXYCODONE-ACETAMINOPHEN 10-325 MG PO TABS: 1.0000 | ORAL_TABLET | Freq: Four times a day (QID) | ORAL | 0 refills | Status: AC | PRN

## 2020-01-23 ENCOUNTER — Other Ambulatory Visit: Payer: Self-pay

## 2020-01-23 ENCOUNTER — Encounter: Payer: Self-pay | Admitting: Podiatry

## 2020-01-23 ENCOUNTER — Ambulatory Visit (INDEPENDENT_AMBULATORY_CARE_PROVIDER_SITE_OTHER): Payer: 59

## 2020-01-23 ENCOUNTER — Ambulatory Visit (INDEPENDENT_AMBULATORY_CARE_PROVIDER_SITE_OTHER): Payer: 59 | Admitting: Podiatry

## 2020-01-23 DIAGNOSIS — Z9889 Other specified postprocedural states: Secondary | ICD-10-CM

## 2020-01-23 DIAGNOSIS — Q6689 Other  specified congenital deformities of feet: Secondary | ICD-10-CM

## 2020-01-23 NOTE — Progress Notes (Signed)
  Subjective:  Patient ID: Maurice Freeman, male    DOB: 1996/01/11,  MRN: [id number]  Chief Complaint  Patient presents with  . Routine Post Op    POV #1 DOS 01/16/20 RIGHT TALOCALCANEAL COALITION RESECTION W/USE OF BONE WAX     24 y.o. male returns for post-op check. Patient is doing well.  He does not have much pain.  He states his motor or sensory functions are intact.  He has been nonweightbearing with a cam boot on.  He has been using his crutches.  He denies any other acute complaints.  Review of Systems: Negative except as noted in the HPI. Denies N/V/F/Ch.  Past Medical History:  Diagnosis Date  . Medical history non-contributory     Current Outpatient Medications:  .  ibuprofen (ADVIL) 800 MG tablet, Take 1 tablet (800 mg total) by mouth every 6 (six) hours as needed., Disp: 60 tablet, Rfl: 1 .  oxyCODONE-acetaminophen (PERCOCET) 10-325 MG tablet, Take 1 tablet by mouth every 6 (six) hours as needed for up to 8 days for pain., Disp: 30 tablet, Rfl: 0 .  citalopram (CELEXA) 20 MG tablet, Take 1 tablet (20 mg total) by mouth daily., Disp: 30 tablet, Rfl: 0 .  hydrOXYzine (ATARAX/VISTARIL) 25 MG tablet, Take 1 tablet (25 mg total) by mouth every 6 (six) hours as needed for anxiety., Disp: 30 tablet, Rfl: 0 .  traZODone (DESYREL) 50 MG tablet, Take 1 tablet (50 mg total) by mouth at bedtime and may repeat dose one time if needed., Disp: 30 tablet, Rfl: 0  Social History   Tobacco Use  Smoking Status Current Some Day Smoker  . Packs/day: 0.25    No Known Allergies Objective:  There were no vitals filed for this visit. There is no height or weight on file to calculate BMI. Constitutional Well developed. Well nourished.  Vascular Foot warm and well perfused. Capillary refill normal to all digits.   Neurologic Normal speech. Oriented to person, place, and time. Epicritic sensation to light touch grossly present bilaterally.  Dermatologic Skin healing well without  signs of infection. Skin edges well coapted without signs of infection.  Orthopedic: Tenderness to palpation noted about the surgical site.   Radiographs: 3 views of skeletally mature adult ankle: Status post correction/resection of medial facet coalition.  Good correction and deformity noted.  Unable to appreciate the correction properly.  Assessment:   1. Tarsal coalition of right foot   2. Status post foot surgery    Plan:  Patient was evaluated and treated and all questions answered.  S/p foot surgery right -Progressing as expected post-operatively. -XR: See above -WB Status: Nonweightbearing in right lower extremity in crutches -Sutures: Staples are intact.  No clinical signs of dehiscence noted.  No clinical signs of infection noted. -Medications: None -Foot redressed.  No follow-ups on file.

## 2020-02-01 ENCOUNTER — Ambulatory Visit (INDEPENDENT_AMBULATORY_CARE_PROVIDER_SITE_OTHER): Payer: 59 | Admitting: Podiatry

## 2020-02-01 ENCOUNTER — Ambulatory Visit (INDEPENDENT_AMBULATORY_CARE_PROVIDER_SITE_OTHER): Payer: 59

## 2020-02-01 ENCOUNTER — Other Ambulatory Visit: Payer: Self-pay

## 2020-02-01 DIAGNOSIS — Q6689 Other  specified congenital deformities of feet: Secondary | ICD-10-CM

## 2020-02-01 DIAGNOSIS — T148XXA Other injury of unspecified body region, initial encounter: Secondary | ICD-10-CM

## 2020-02-01 DIAGNOSIS — Z9889 Other specified postprocedural states: Secondary | ICD-10-CM

## 2020-02-02 ENCOUNTER — Encounter: Payer: Self-pay | Admitting: Podiatry

## 2020-02-02 NOTE — Progress Notes (Signed)
Subjective:  Patient ID: Maurice Freeman, male    DOB: [birth year],  MRN: [id number]  Chief Complaint  Patient presents with  . 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 Pt states fell last Wednesday and put full weight on surgical foot. Pt states it was painful and pain lasted for a day and a half. Denies fever/chills/nausea/vomiting.     24 y.o. male returns for post-op check. Patient is doing well.  He does not have much pain.  He states his motor or sensory functions are intact.  He has been nonweightbearing with a cam boot on.  He has been using his crutches.  He denies any other acute complaints.  He states that he also had a minor fall last Wednesday and had a place weight on the foot.  Patient said there is some throbbing sensation afterwards for day and a half but then progressed to go better.  He would like to make sure they did not cause any other fracture or anything like that.  He denies any other acute complaints.  Review of Systems: Negative except as noted in the HPI. Denies N/V/F/Ch.  Past Medical History:  Diagnosis Date  . Medical history non-contributory     Current Outpatient Medications:  .  ibuprofen (ADVIL) 800 MG tablet, Take 1 tablet (800 mg total) by mouth every 6 (six) hours as needed., Disp: 60 tablet, Rfl: 1 .  oxyCODONE-acetaminophen (PERCOCET) 10-325 MG tablet, , Disp: , Rfl:   Social History   Tobacco Use  Smoking Status Current Some Day Smoker  . Packs/day: 0.25    No Known Allergies Objective:  There were no vitals filed for this visit. There is no height or weight on file to calculate BMI. Constitutional Well developed. Well nourished.  Vascular Foot warm and well perfused. Capillary refill normal to all digits.   Neurologic Normal speech. Oriented to person, place, and time. Epicritic sensation to light touch grossly present bilaterally.  Dermatologic Skin healing well without  signs of infection. Skin edges well coapted without signs of infection.  Orthopedic: Tenderness to palpation noted about the surgical site.   Radiographs: 3 views of skeletally mature adult ankle: Status post correction/resection of medial facet coalition.  Good correction and deformity noted.  No signs of fractures noted.  No acute abnormalities noted.  Assessment:   1. Tarsal coalition of right foot    Plan:  Patient was evaluated and treated and all questions answered.  Mild soft tissue contusion secondary to minor fall -Clinically patient does not have any pain to the ankle.  It appears that the pain itself have resolved.  The x-rays were radiographically negative for any kind of fractures at this time.  Patient will continue to be nonweightbearing to the right lower extremity with the boot on.  This should help resolve some of the residual inflammation.  Patient states understanding.  S/p foot surgery right -Progressing as expected post-operatively. -XR: See above -WB Status: Nonweightbearing in right lower extremity in crutches.  I will plan on transitioning him to weightbearing as tolerated in cam boot during next clinical visit -Sutures: Staples will be removed during next clinical visit.  At this time no clinical signs of infection or dehiscence noted. -Medications: None -Foot redressed. -I believe he will also benefit from physical therapy during next clinical visit.  Patient will be scheduled to undergo physical therapy after removal of staples and transition to weightbearing as tolerated in cam boot.  No follow-ups on file.

## 2020-02-22 ENCOUNTER — Ambulatory Visit (INDEPENDENT_AMBULATORY_CARE_PROVIDER_SITE_OTHER): Payer: 59

## 2020-02-22 ENCOUNTER — Ambulatory Visit (INDEPENDENT_AMBULATORY_CARE_PROVIDER_SITE_OTHER): Payer: 59 | Admitting: Podiatry

## 2020-02-22 ENCOUNTER — Other Ambulatory Visit: Payer: Self-pay

## 2020-02-22 DIAGNOSIS — Z9889 Other specified postprocedural states: Secondary | ICD-10-CM

## 2020-02-22 DIAGNOSIS — R2681 Unsteadiness on feet: Secondary | ICD-10-CM

## 2020-02-22 DIAGNOSIS — Q6689 Other  specified congenital deformities of feet: Secondary | ICD-10-CM

## 2020-02-23 ENCOUNTER — Telehealth: Payer: Self-pay | Admitting: *Deleted

## 2020-02-23 DIAGNOSIS — Q6689 Other  specified congenital deformities of feet: Secondary | ICD-10-CM

## 2020-02-23 DIAGNOSIS — R2681 Unsteadiness on feet: Secondary | ICD-10-CM

## 2020-02-23 DIAGNOSIS — Z9889 Other specified postprocedural states: Secondary | ICD-10-CM

## 2020-02-23 NOTE — Telephone Encounter (Signed)
-----   Message from Candelaria Stagers, DPM sent at 02/22/2020  8:45 AM EDT ----- Regarding: Physical therapy referral Hi Azhar Yogi,  Continue physical therapy referral for this patient for general deconditioning, strengthening of the right lower extremity, gait training.  I would like for the patient to transition into regular sneakers by the end of 4 weeks.  Thank you

## 2020-02-23 NOTE — Telephone Encounter (Signed)
Delivered to [employer]. 

## 2020-02-24 ENCOUNTER — Encounter: Payer: Self-pay | Admitting: Podiatry

## 2020-02-28 ENCOUNTER — Encounter: Payer: Self-pay | Admitting: Podiatry

## 2020-02-28 NOTE — Progress Notes (Signed)
  Subjective:  Patient ID: Maurice Freeman, male    DOB: July 10, 1996,  MRN: [id number]  Chief Complaint  Patient presents with  . 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 Pt states healing well with no concerns. Pt does state some numbness/swelling at surgical site as well as occasional sharp pain. Denies fever/chills/nausea/vomiting.      24 y.o. male returns for post-op check.  Patient is doing well.  He does not have any acute complaints of pain.  It will have his staples removed today.  He denies any other issues.  He occasionally gets sharp shooting pain but then resolves by itself.  Review of Systems: Negative except as noted in the HPI. Denies N/V/F/Ch.  Past Medical History:  Diagnosis Date  . Medical history non-contributory     Current Outpatient Medications:  .  ibuprofen (ADVIL) 800 MG tablet, Take 1 tablet (800 mg total) by mouth every 6 (six) hours as needed., Disp: 60 tablet, Rfl: 1 .  oxyCODONE-acetaminophen (PERCOCET) 10-325 MG tablet, , Disp: , Rfl:   Social History   Tobacco Use  Smoking Status Current Some Day Smoker  . Packs/day: 0.25    No Known Allergies Objective:  There were no vitals filed for this visit. There is no height or weight on file to calculate BMI. Constitutional Well developed. Well nourished.  Vascular Foot warm and well perfused. Capillary refill normal to all digits.   Neurologic Normal speech. Oriented to person, place, and time. Epicritic sensation to light touch grossly present bilaterally.  Dermatologic Skin healing well without signs of infection. Skin edges well coapted without signs of infection.  Orthopedic:  Mild tenderness to palpation noted about the surgical site.  Good range of motion noted of the subtalar joint.  Normal pain from surgical site noted.  It appears that patient has had improvement in subtalar joint from prior to surgery.   Radiographs: 3 views  of skeletally mature adult ankle: Status post correction/resection of medial facet coalition.  Good correction and deformity noted.  No signs of fractures noted.  No acute abnormalities noted.  Assessment:   1. Tarsal coalition of right foot   2. Gait instability   3. Status post foot surgery    Plan:  Patient was evaluated and treated and all questions answered.  Mild soft tissue contusion secondary to minor fall -Clinically this has resolved.  Patient does not have any further pain.  S/p foot surgery right -Progressing as expected post-operatively. -XR: See above -WB Status: Nonweightbearing in right lower extremity in crutches.  I will plan on transitioning him to weightbearing as tolerated in cam boot during next clinical visit -Sutures: Staples were removed no complication noted no wound dehiscence noted. -Medications: None -Foot redressed. -Given the general deconditioning I believe patient will benefit from physical therapy for gait training conditioning and improving range of motion.  He will be sent prescription for physical therapy for evaluation and management. No follow-ups on file.

## 2020-03-15 ENCOUNTER — Encounter: Payer: Self-pay | Admitting: Podiatry

## 2020-03-19 ENCOUNTER — Telehealth: Payer: Self-pay | Admitting: Podiatry

## 2020-03-19 NOTE — Telephone Encounter (Signed)
That is fine with me.

## 2020-03-19 NOTE — Telephone Encounter (Signed)
Physical therapy would like to see pt back for another month. Please advise.

## 2020-03-21 ENCOUNTER — Other Ambulatory Visit: Payer: Self-pay

## 2020-03-21 ENCOUNTER — Ambulatory Visit (INDEPENDENT_AMBULATORY_CARE_PROVIDER_SITE_OTHER): Payer: 59 | Admitting: Podiatry

## 2020-03-21 DIAGNOSIS — Q6689 Other  specified congenital deformities of feet: Secondary | ICD-10-CM

## 2020-03-21 DIAGNOSIS — Z9889 Other specified postprocedural states: Secondary | ICD-10-CM

## 2020-03-22 ENCOUNTER — Encounter: Payer: Self-pay | Admitting: Podiatry

## 2020-03-22 NOTE — Progress Notes (Signed)
  Subjective:  Patient ID: Maurice Freeman, male    DOB: 16-Jun-1996,  MRN: [id number]  Chief Complaint  Patient presents with  . Routine Post Op    POV #4 DOS 01/16/20 RIGHT TALOCALCANEAL COALITION RESECTION W/USE OF BONE WAX     24 y.o. male returns for post-op check.  Patient is doing well.  He does not have any acute complaints of pain.  He is doing well.  His skin has completely healed.  He is doing physical therapy which is seems to be improving his pain a lot.  Review of Systems: Negative except as noted in the HPI. Denies N/V/F/Ch.  Past Medical History:  Diagnosis Date  . Medical history non-contributory     Current Outpatient Medications:  .  ibuprofen (ADVIL) 800 MG tablet, Take 1 tablet (800 mg total) by mouth every 6 (six) hours as needed., Disp: 60 tablet, Rfl: 1 .  oxyCODONE-acetaminophen (PERCOCET) 10-325 MG tablet, , Disp: , Rfl:   Social History   Tobacco Use  Smoking Status Current Some Day Smoker  . Packs/day: 0.25    No Known Allergies Objective:  There were no vitals filed for this visit. There is no height or weight on file to calculate BMI. Constitutional Well developed. Well nourished.  Vascular Foot warm and well perfused. Capillary refill normal to all digits.   Neurologic Normal speech. Oriented to person, place, and time. Epicritic sensation to light touch grossly present bilaterally.  Dermatologic Skin healing well without signs of infection. Skin edges well coapted without signs of infection.  Orthopedic:  Mild tenderness to palpation noted about the surgical site.  Good range of motion noted of the subtalar joint.  Normal pain from surgical site noted.  It appears that patient has had improvement in range of motion subtalar joint from prior to surgery.  Good range of motion noted at the subtalar joint.  Greater than 7 degrees of motion and eversion and greater than 5 degrees of motion on inversion of the calcaneus.   Radiographs: 3 views  of skeletally mature adult ankle: Status post correction/resection of medial facet coalition.  Good correction and deformity noted.  No signs of fractures noted.  No acute abnormalities noted.  Assessment:   1. Tarsal coalition of right foot   2. Status post foot surgery    Plan:  Patient was evaluated and treated and all questions answered.  Mild soft tissue contusion secondary to minor fall -Clinically this has resolved.  Patient does not have any further pain.  S/p foot surgery right -Progressing as expected post-operatively. -XR: See above -WB Status: He can begin transition to weightbearing as tolerated from cam boot to regular sneakers by next clinic visit. -Sutures: Staples were removed no complication noted no wound dehiscence noted. -Medications: None -Foot redressed. -Continue physical therapy. No follow-ups on file.

## 2020-03-28 ENCOUNTER — Telehealth: Payer: Self-pay | Admitting: Podiatry

## 2020-03-28 NOTE — Telephone Encounter (Signed)
Pt is requesting a handicap placard.  Patient is still in physical therapy.  He has started driving but thinks he needs to park closer.

## 2020-03-29 NOTE — Telephone Encounter (Signed)
That is fine you can send him handicap placard.  Thank you

## 2020-05-02 ENCOUNTER — Other Ambulatory Visit: Payer: Self-pay

## 2020-05-02 ENCOUNTER — Ambulatory Visit (INDEPENDENT_AMBULATORY_CARE_PROVIDER_SITE_OTHER): Payer: 59 | Admitting: Podiatry

## 2020-05-02 DIAGNOSIS — R2681 Unsteadiness on feet: Secondary | ICD-10-CM

## 2020-05-02 DIAGNOSIS — Q6689 Other  specified congenital deformities of feet: Secondary | ICD-10-CM | POA: Diagnosis not present

## 2020-05-02 DIAGNOSIS — Q666 Other congenital valgus deformities of feet: Secondary | ICD-10-CM | POA: Diagnosis not present

## 2020-05-04 ENCOUNTER — Encounter: Payer: Self-pay | Admitting: Podiatry

## 2020-05-04 NOTE — Progress Notes (Signed)
Subjective:  Patient ID: Maurice Freeman, male    DOB: [birth year],  MRN: [id number]  Chief Complaint  Patient presents with  . Routine Post Op    6 wk fu  DOS 01/16/20 RIGHT TALOCALCANEAL COALITION RESECTION W/USE OF BONE WAX     24 y.o. male returns for post-op check.  Patient is doing overall really well.  Patient has gone to 4 sessions of physical therapy for which she is doing much better he is able to ambulate without any issues.  His skin has completely healed.  He states the physical therapy has been helping and would like to continue doing that.  He is also wants to discuss if there is any kind of orthotics management that could help assist afterwards.  He denies any other acute complaints..  Review of Systems: Negative except as noted in the HPI. Denies N/V/F/Ch.  Past Medical History:  Diagnosis Date  . Medical history non-contributory     Current Outpatient Medications:  .  ibuprofen (ADVIL) 800 MG tablet, Take 1 tablet (800 mg total) by mouth every 6 (six) hours as needed., Disp: 60 tablet, Rfl: 1 .  oxyCODONE-acetaminophen (PERCOCET) 10-325 MG tablet, , Disp: , Rfl:   Social History   Tobacco Use  Smoking Status Current Some Day Smoker  . Packs/day: 0.25    No Known Allergies Objective:  There were no vitals filed for this visit. There is no height or weight on file to calculate BMI. Constitutional Well developed. Well nourished.  Vascular Foot warm and well perfused. Capillary refill normal to all digits.   Neurologic Normal speech. Oriented to person, place, and time. Epicritic sensation to light touch grossly present bilaterally.  Dermatologic Skin healing well without signs of infection. Skin edges well coapted without signs of infection.  Orthopedic:  Mild tenderness to palpation noted about the surgical site.  Good range of motion noted of the subtalar joint.  Normal pain from surgical site noted.  It appears that patient has had improvement in range  of motion subtalar joint from prior to surgery.  Good range of motion noted at the subtalar joint.  Greater than 7 degrees of motion and eversion and greater than 5 degrees of motion on inversion of the calcaneus.   Radiographs: 3 views of skeletally mature adult ankle: Status post correction/resection of medial facet coalition.  Good correction and deformity noted.  No signs of fractures noted.  No acute abnormalities noted.  Assessment:   1. Tarsal coalition of right foot   2. Gait instability   3. Pes planovalgus    Plan:  Patient was evaluated and treated and all questions answered.  Pes planovalgus -I explained to patient the etiology of pes planovalgus with this relationship with tarsal coalition and various treatment options were extensively discussed.  I believe patient will benefit from custom-made orthotics to help control the hindfoot motion as well as support the arches of the foot.  Patient agrees with the plan would like to proceed with getting orthotics. -He will be scheduled to see with [facility] for custom-made orthotics  Mild soft tissue contusion secondary to minor fall -Clinically this has resolved.  Patient does not have any further pain.  S/p foot surgery right -Progressing as expected post-operatively. -XR: See above -WB Status: He can begin transition to weightbearing as tolerated from cam boot to regular sneakers by next clinic visit. -Sutures: Staples were removed no complication noted no wound dehiscence noted. -Medications: None -Foot redressed. -Continue physical therapy. No follow-ups on  file.

## 2020-05-08 ENCOUNTER — Other Ambulatory Visit: Payer: Self-pay

## 2020-05-08 ENCOUNTER — Ambulatory Visit (INDEPENDENT_AMBULATORY_CARE_PROVIDER_SITE_OTHER): Payer: 59 | Admitting: Orthotics

## 2020-05-08 DIAGNOSIS — Q666 Other congenital valgus deformities of feet: Secondary | ICD-10-CM

## 2020-05-08 DIAGNOSIS — Q6689 Other  specified congenital deformities of feet: Secondary | ICD-10-CM | POA: Diagnosis not present

## 2020-05-08 DIAGNOSIS — R2681 Unsteadiness on feet: Secondary | ICD-10-CM

## 2020-05-08 NOTE — Progress Notes (Signed)
Patient cast to day for cmfo to address hx of tarsal coalition sx. Plan on deep heel seat and 3* varus RF post to add supinatory torgue to RF; also good arch support both medial and transverse.

## 2020-05-30 ENCOUNTER — Encounter: Payer: Self-pay | Admitting: Podiatry

## 2020-05-30 ENCOUNTER — Ambulatory Visit (INDEPENDENT_AMBULATORY_CARE_PROVIDER_SITE_OTHER): Payer: 59 | Admitting: Podiatry

## 2020-05-30 ENCOUNTER — Other Ambulatory Visit: Payer: Self-pay

## 2020-05-30 DIAGNOSIS — Q6689 Other  specified congenital deformities of feet: Secondary | ICD-10-CM

## 2020-05-30 DIAGNOSIS — Q666 Other congenital valgus deformities of feet: Secondary | ICD-10-CM

## 2020-05-30 DIAGNOSIS — R2681 Unsteadiness on feet: Secondary | ICD-10-CM | POA: Diagnosis not present

## 2020-06-01 ENCOUNTER — Encounter: Payer: Self-pay | Admitting: Podiatry

## 2020-06-01 NOTE — Progress Notes (Signed)
  Subjective:  Patient ID: Maurice Freeman, male    DOB: 22-Mar-1996,  MRN: [id number]  Chief Complaint  Patient presents with  . Routine Post Op    DOS 01/16/20 RIGHT TALOCALCANEAL COALITION RESECTION W/USE OF BONE WAX     24 y.o. male returns for post-op check.  Patient is doing well.  His physical therapy is going excellent.  He would like to return to work on limited basis.  He is also going to be picking up orthotics today.  Review of Systems: Negative except as noted in the HPI. Denies N/V/F/Ch.  Past Medical History:  Diagnosis Date  . Medical history non-contributory     Current Outpatient Medications:  .  ibuprofen (ADVIL) 800 MG tablet, Take 1 tablet (800 mg total) by mouth every 6 (six) hours as needed., Disp: 60 tablet, Rfl: 1 .  oxyCODONE-acetaminophen (PERCOCET) 10-325 MG tablet, , Disp: , Rfl:   Social History   Tobacco Use  Smoking Status Current Some Day Smoker  . Packs/day: 0.25    No Known Allergies Objective:  There were no vitals filed for this visit. There is no height or weight on file to calculate BMI. Constitutional Well developed. Well nourished.  Vascular Foot warm and well perfused. Capillary refill normal to all digits.   Neurologic Normal speech. Oriented to person, place, and time. Epicritic sensation to light touch grossly present bilaterally.  Dermatologic Skin healing well without signs of infection. Skin edges well coapted without signs of infection.  Orthopedic:  Mild tenderness to palpation noted about the surgical site.  Good range of motion noted of the subtalar joint.  Normal pain from surgical site noted.  It appears that patient has had improvement in range of motion subtalar joint from prior to surgery.  Good range of motion noted at the subtalar joint.  Greater than 7 degrees of motion and eversion and greater than 5 degrees of motion on inversion of the calcaneus.   Radiographs: 3 views of skeletally mature adult ankle:  Status post correction/resection of medial facet coalition.  Good correction and deformity noted.  No signs of fractures noted.  No acute abnormalities noted.  Assessment:   1. Tarsal coalition of right foot   2. Gait instability   3. Pes planovalgus    Plan:  Patient was evaluated and treated and all questions answered.  Pes planovalgus -I explained to patient the etiology of pes planovalgus with this relationship with tarsal coalition and various treatment options were extensively discussed.  I believe patient will benefit from custom-made orthotics to help control the hindfoot motion as well as support the arches of the foot.  Patient agrees with the plan would like to proceed with getting orthotics. -Patient obtain orthotics today and has been functioning well without any acute issues.  I spoke to him about breaking the orthotics and.  Patient states understanding  Mild soft tissue contusion secondary to minor fall -Clinically this has resolved.  Patient does not have any further pain.  S/p foot surgery right -Progressing as expected post-operatively. -XR: See above -WB Status: He can begin transition to weightbearing as tolerated from cam boot to regular sneakers by next clinic visit. -Sutures: Staples were removed no complication noted no wound dehiscence noted. -Medications: None -Foot redressed. -Continue physical therapy can start discontinuing physical therapy once he has resumed to activities. -He can return to work on regular basis. No follow-ups on file.

## 2020-06-05 ENCOUNTER — Other Ambulatory Visit: Payer: 59 | Admitting: Orthotics

## 2020-08-29 ENCOUNTER — Ambulatory Visit: Payer: 59 | Admitting: Podiatry

## 2020-10-31 ENCOUNTER — Ambulatory Visit (INDEPENDENT_AMBULATORY_CARE_PROVIDER_SITE_OTHER): Payer: BC Managed Care – PPO | Admitting: Podiatry

## 2020-10-31 ENCOUNTER — Other Ambulatory Visit: Payer: Self-pay

## 2020-10-31 ENCOUNTER — Encounter: Payer: Self-pay | Admitting: Podiatry

## 2020-10-31 DIAGNOSIS — Q6689 Other  specified congenital deformities of feet: Secondary | ICD-10-CM | POA: Diagnosis not present

## 2020-10-31 DIAGNOSIS — R2681 Unsteadiness on feet: Secondary | ICD-10-CM | POA: Diagnosis not present

## 2020-11-01 ENCOUNTER — Encounter: Payer: Self-pay | Admitting: Podiatry

## 2020-11-01 NOTE — Progress Notes (Signed)
  Subjective:  Patient ID: Maurice Freeman, male    DOB: 1995-11-07,  MRN: [id number]  Chief Complaint  Patient presents with  . Foot Pain    DOS 01/16/20 RIGHT TALOCALCANEAL COALITION RESECTION W/USE OF BONE WAX   "Its feeling okay, just started back PT"     25 y.o. male returns for post-op check.  Patient is doing well.  His physical therapy is going excellent.  He would like to return to work on limited basis.  He is also going to be picking up orthotics today.  Review of Systems: Negative except as noted in the HPI. Denies N/V/F/Ch.  Past Medical History:  Diagnosis Date  . Medical history non-contributory     Current Outpatient Medications:  .  ibuprofen (ADVIL) 800 MG tablet, Take 1 tablet (800 mg total) by mouth every 6 (six) hours as needed., Disp: 60 tablet, Rfl: 1 .  oxyCODONE-acetaminophen (PERCOCET) 10-325 MG tablet, , Disp: , Rfl:   Social History   Tobacco Use  Smoking Status Current Some Day Smoker  . Packs/day: 0.25  Smokeless Tobacco Not on file    No Known Allergies Objective:  There were no vitals filed for this visit. There is no height or weight on file to calculate BMI. Constitutional Well developed. Well nourished.  Vascular Foot warm and well perfused. Capillary refill normal to all digits.   Neurologic Normal speech. Oriented to person, place, and time. Epicritic sensation to light touch grossly present bilaterally.  Dermatologic Skin healing well without signs of infection. Skin edges well coapted without signs of infection.  Orthopedic:  Mild tenderness to palpation noted about the surgical site.  Good range of motion noted of the subtalar joint.  Normal pain from surgical site noted.  It appears that patient has had improvement in range of motion subtalar joint from prior to surgery.  Good range of motion noted at the subtalar joint.  Greater than 7 degrees of motion and eversion and greater than 5 degrees of motion on inversion of the  calcaneus.   Radiographs: 3 views of skeletally mature adult ankle: Status post correction/resection of medial facet coalition.  Good correction and deformity noted.  No signs of fractures noted.  No acute abnormalities noted.  Assessment:   1. Gait instability   2. Tarsal coalition of right foot    Plan:  Patient was evaluated and treated and all questions answered.  Pes planovalgus -I explained to patient the etiology of pes planovalgus with this relationship with tarsal coalition and various treatment options were extensively discussed.  I believe patient will benefit from custom-made orthotics to help control the hindfoot motion as well as support the arches of the foot.  Patient agrees with the plan would like to proceed with getting orthotics. -Patient obtain orthotics today and has been functioning well without any acute issues.  I spoke to him about breaking the orthotics and.  Patient states understanding  Mild soft tissue contusion secondary to minor fall -Clinically this has resolved.  Patient does not have any further pain.  S/p foot surgery right -Progressing as expected post-operatively. -XR: See above -WB Status: He can begin transition to weightbearing as tolerated from cam boot to regular sneakers by next clinic visit. -Sutures: Staples were removed no complication noted no wound dehiscence noted. -Medications: None -Foot redressed. -Continue physical therapy can start discontinuing physical therapy once he has resumed to activities. -He can return to work on regular basis. No follow-ups on file.

## 2022-01-10 IMAGING — MR MR ANKLE*R* W/O CM
6 series · 40 of 40 positions shown · non-contrast
Comparison: Radiographs 11/21/2012 and 11/11/2019

CLINICAL DATA: Right hindfoot pain.

EXAM:
MRI OF THE RIGHT ANKLE WITHOUT CONTRAST
TECHNIQUE: Multiplanar, multisequence MR imaging of the ankle was performed. No
intravenous contrast was administered.

[Series 4: T2 fat-sat · axial · 3.0mm · 0.62mm/px · z∈[-51,+85]mm · 8 of 36 slices shown (1 of 2)]
[im 1/36]
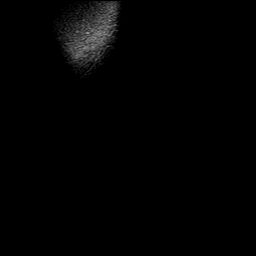
[im 6/36]
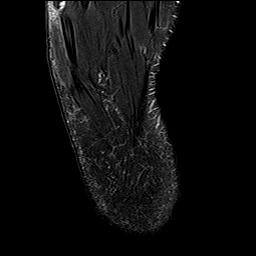
[im 11/36]
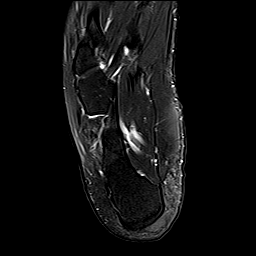
[im 16/36]
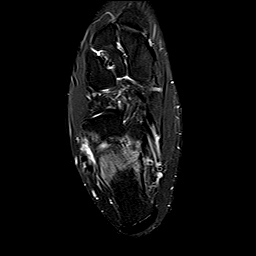
[im 21/36]
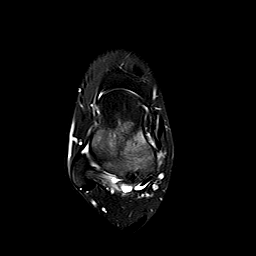
[im 26/36]
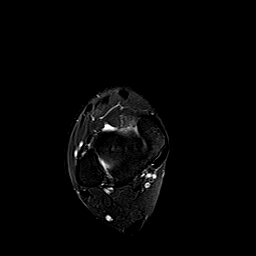
[im 31/36]
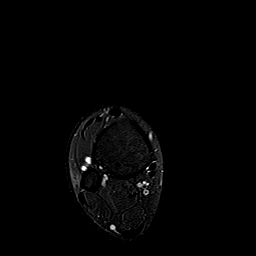
[im 36/36]
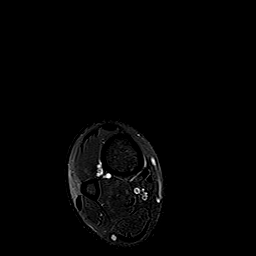

[Series 5: PD fat-sat · axial · 3.0mm · 0.50mm/px · z∈[-51,+85]mm · 8 of 36 slices shown]
[im 1/36]
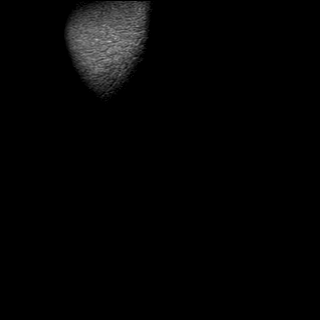
[im 6/36]
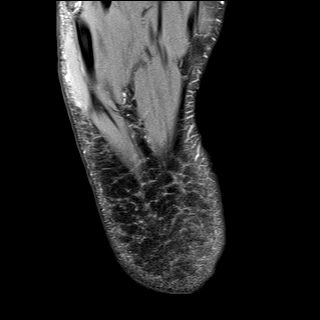
[im 11/36]
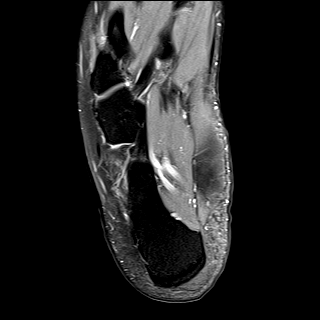
[im 16/36]
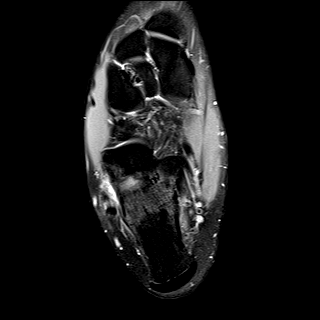
[im 21/36]
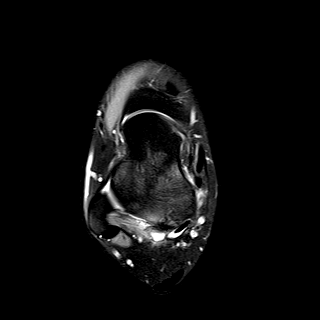
[im 26/36]
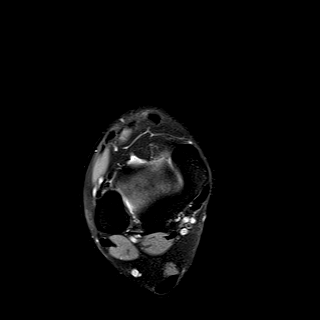
[im 31/36]
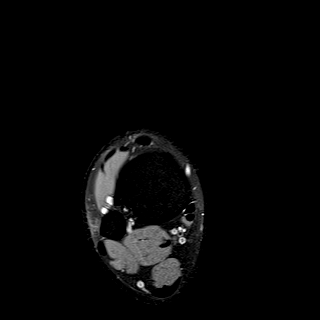
[im 36/36]
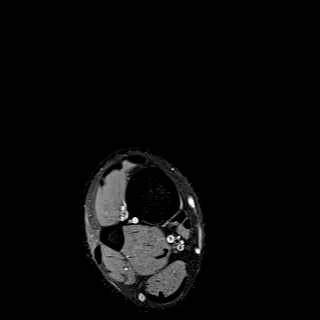

[Series 6: T1 · axial · 3.0mm · 0.62mm/px · z∈[-50,+86]mm · 8 of 36 slices shown (1 of 2)]
[im 1/36]
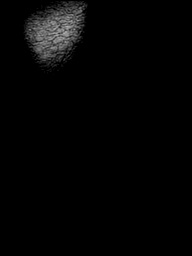
[im 6/36]
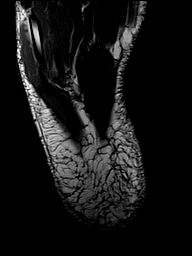
[im 11/36]
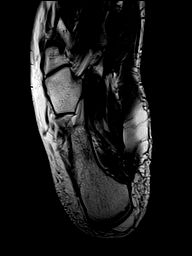
[im 16/36]
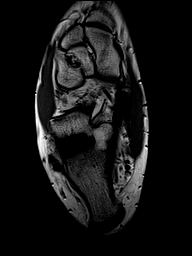
[im 21/36]
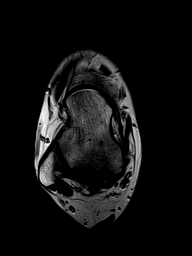
[im 26/36]
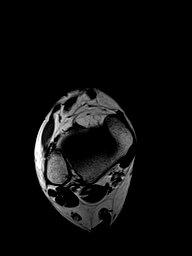
[im 31/36]
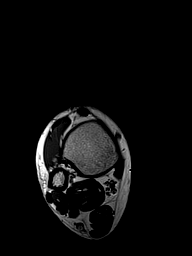
[im 36/36]
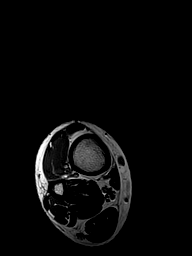

[Series 7: T1 · sagittal · 4.0mm · 0.56mm/px · 4 of 19 slices shown (2 of 2)]
[im 1/19]
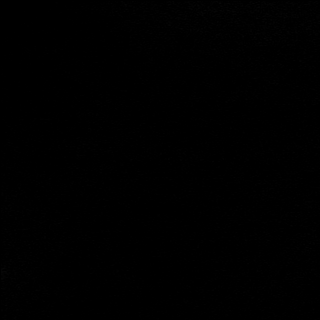
[im 7/19]
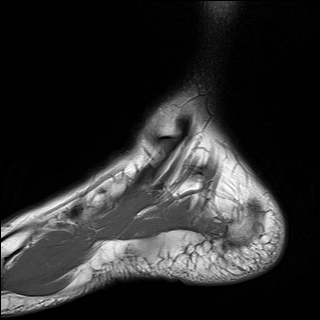
[im 13/19]
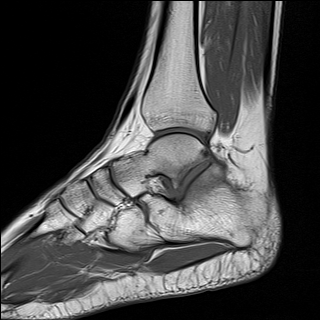
[im 19/19]
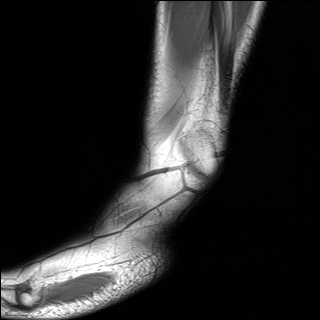

[Series 8: STIR · sagittal · 4.0mm · 0.47mm/px · 4 of 18 slices shown]
[im 1/18]
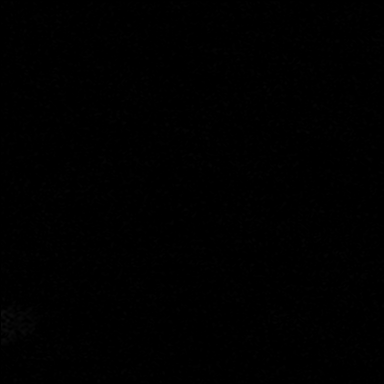
[im 6/18]
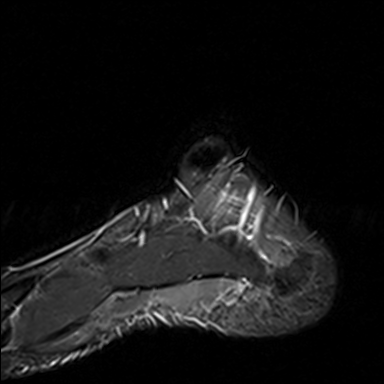
[im 12/18]
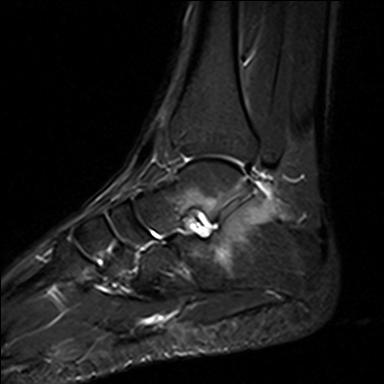
[im 18/18]
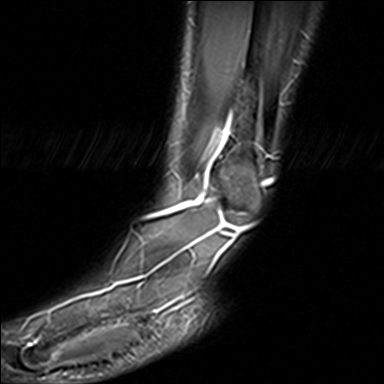

[Series 9: T2 fat-sat · coronal · 3.0mm · 0.62mm/px · 8 of 39 slices shown (2 of 2)]
[im 1/39]
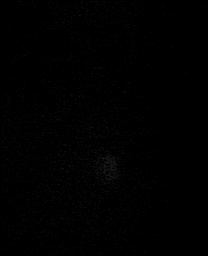
[im 6/39]
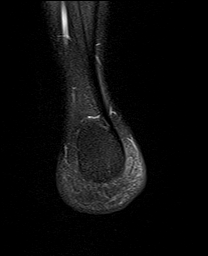
[im 11/39]
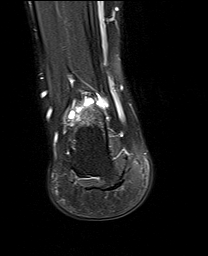
[im 17/39]
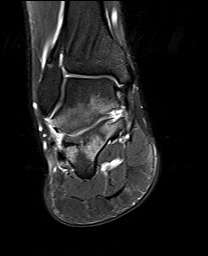
[im 22/39]
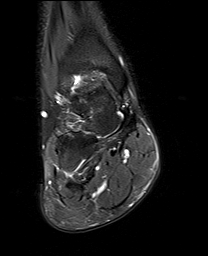
[im 28/39]
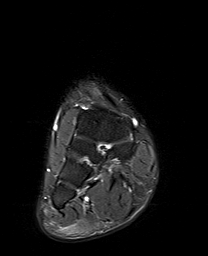
[im 33/39]
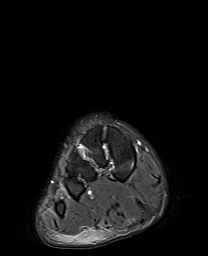
[im 39/39]
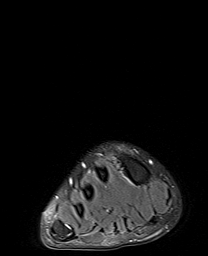

[40 of 40 positions shown; findings below may reference images not displayed]

FINDINGS: TENDONS

Peroneal: Intact. No tendinopathy or tenosynovitis.

Posteromedial: Intact. No significant tendinopathy or tenosynovitis.

Anterior: Intact. No tendinopathy or tenosynovitis.

Achilles: Normal

Plantar Fascia: Intact

LIGAMENTS

Lateral: Intact

Medial: Intact

CARTILAGE

Ankle Joint: No degenerative changes, joint effusion, cartilage
defect or osteochondral lesion.

Subtalar Joints/Sinus Tarsi: Severe reactive marrow edema around the
posterior talocalcaneal facet with evidence of coalition
posteromedially. Prominent medial process and spurring changes.
Possible associated small subchondral stress fracture involving the
posterior calcaneus.

The sinus tarsi demonstrates moderate fluid and mild inflammation
but the cervical and interosseous ligaments appear intact and the
spring ligament is intact.

Bones: No other significant bony findings.

Other: Normal foot and ankle musculature.
IMPRESSION: 1. Significant reactive marrow edema around the posterior
talocalcaneal facet with evidence of coalition posteromedially.
Possible associated small subchondral stress fracture involving the
calcaneus.
2. Intact medial and lateral ankle ligaments and tendons.
3. No other significant bony findings.

## 2022-05-29 ENCOUNTER — Other Ambulatory Visit: Payer: Self-pay | Admitting: Pain Medicine

## 2022-05-29 ENCOUNTER — Ambulatory Visit
Admission: RE | Admit: 2022-05-29 | Discharge: 2022-05-29 | Disposition: A | Discharge status: Home / Self Care | Payer: BC Managed Care – PPO | Source: Ambulatory Visit | Attending: Pain Medicine | Admitting: Pain Medicine

## 2022-05-29 DIAGNOSIS — M5116 Intervertebral disc disorders with radiculopathy, lumbar region: Secondary | ICD-10-CM
